# Patient Record
Sex: Female | Born: 1960 | Hispanic: No | Marital: Married | Smoking: Never smoker
Health system: Southern US, Community
[De-identification: ages and names within clinical notes are randomized; demographics above are authoritative.]

## PROBLEM LIST (undated history)

## (undated) DIAGNOSIS — E785 Hyperlipidemia, unspecified: Secondary | ICD-10-CM

## (undated) DIAGNOSIS — H9193 Unspecified hearing loss, bilateral: Secondary | ICD-10-CM

## (undated) HISTORY — DX: Hyperlipidemia, unspecified: E78.5

## (undated) HISTORY — DX: Unspecified hearing loss, bilateral: H91.93

---
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
## 1991-06-10 HISTORY — PX: BREAST BIOPSY: SHX20

## 2002-03-09 ENCOUNTER — Other Ambulatory Visit: Admission: RE | Admit: 2002-03-09 | Discharge: 2002-03-09 | Payer: Self-pay | Admitting: Obstetrics and Gynecology

## 2004-05-24 ENCOUNTER — Other Ambulatory Visit: Admission: RE | Admit: 2004-05-24 | Discharge: 2004-05-24 | Payer: Self-pay | Admitting: Obstetrics and Gynecology

## 2011-06-10 HISTORY — PX: COLONOSCOPY: SHX174

## 2012-02-05 ENCOUNTER — Ambulatory Visit (INDEPENDENT_AMBULATORY_CARE_PROVIDER_SITE_OTHER): Payer: 59 | Admitting: Family Medicine

## 2012-02-05 ENCOUNTER — Encounter: Payer: Self-pay | Admitting: Physician Assistant

## 2012-02-05 ENCOUNTER — Ambulatory Visit: Payer: 59

## 2012-02-05 VITALS — BP 110/62 | HR 79 | Temp 98.0°F | Resp 16 | Ht 65.0 in | Wt 148.2 lb

## 2012-02-05 DIAGNOSIS — M25552 Pain in left hip: Secondary | ICD-10-CM

## 2012-02-05 DIAGNOSIS — E782 Mixed hyperlipidemia: Secondary | ICD-10-CM | POA: Insufficient documentation

## 2012-02-05 DIAGNOSIS — Z1239 Encounter for other screening for malignant neoplasm of breast: Secondary | ICD-10-CM

## 2012-02-05 DIAGNOSIS — Z1211 Encounter for screening for malignant neoplasm of colon: Secondary | ICD-10-CM

## 2012-02-05 DIAGNOSIS — H9193 Unspecified hearing loss, bilateral: Secondary | ICD-10-CM | POA: Insufficient documentation

## 2012-02-05 DIAGNOSIS — M461 Sacroiliitis, not elsewhere classified: Secondary | ICD-10-CM

## 2012-02-05 DIAGNOSIS — M25559 Pain in unspecified hip: Secondary | ICD-10-CM

## 2012-02-05 DIAGNOSIS — R55 Syncope and collapse: Secondary | ICD-10-CM

## 2012-02-05 DIAGNOSIS — Z0000 Encounter for general adult medical examination without abnormal findings: Secondary | ICD-10-CM

## 2012-02-05 DIAGNOSIS — Z124 Encounter for screening for malignant neoplasm of cervix: Secondary | ICD-10-CM

## 2012-02-05 LAB — CBC WITH DIFFERENTIAL/PLATELET
Basophils Absolute: 0 10*3/uL (ref 0.0–0.1)
Basophils Relative: 1 % (ref 0–1)
Eosinophils Absolute: 0.1 10*3/uL (ref 0.0–0.7)
MCH: 29.8 pg (ref 26.0–34.0)
MCHC: 33.8 g/dL (ref 30.0–36.0)
Monocytes Relative: 8 % (ref 3–12)
Neutro Abs: 3 10*3/uL (ref 1.7–7.7)
Neutrophils Relative %: 55 % (ref 43–77)
Platelets: 353 10*3/uL (ref 150–400)
RDW: 13.8 % (ref 11.5–15.5)

## 2012-02-05 LAB — COMPREHENSIVE METABOLIC PANEL
AST: 17 U/L (ref 0–37)
Alkaline Phosphatase: 58 U/L (ref 39–117)
Glucose, Bld: 82 mg/dL (ref 70–99)
Potassium: 4.2 mEq/L (ref 3.5–5.3)
Sodium: 137 mEq/L (ref 135–145)
Total Bilirubin: 0.5 mg/dL (ref 0.3–1.2)
Total Protein: 7.1 g/dL (ref 6.0–8.3)

## 2012-02-05 LAB — POCT UA - MICROSCOPIC ONLY
Bacteria, U Microscopic: NEGATIVE
Mucus, UA: NEGATIVE

## 2012-02-05 LAB — LIPID PANEL
LDL Cholesterol: 162 mg/dL — ABNORMAL HIGH (ref 0–99)
Triglycerides: 96 mg/dL (ref <150)
VLDL: 19 mg/dL (ref 0–40)

## 2012-02-05 LAB — TSH: TSH: 3.223 u[IU]/mL (ref 0.350–4.500)

## 2012-02-05 LAB — POCT URINALYSIS DIPSTICK
Glucose, UA: NEGATIVE
Ketones, UA: NEGATIVE
Spec Grav, UA: 1.015

## 2012-02-05 NOTE — Progress Notes (Signed)
Subjective:    Patient ID: Patricia Campos, female    DOB: 1960-08-17, 51 y.o.   MRN: [id number]  HPI This 51 y.o. female presents for CPE.  Review of Systems  Constitutional: Negative.   HENT: Negative.   Eyes: Negative.   Respiratory: Negative.   Cardiovascular: Negative.   Gastrointestinal: Negative.   Genitourinary: Negative.   Musculoskeletal: Positive for arthralgias (left hip pain at night.  Resolves when she straightens her leg (likes to sleep on her side with hips and knees flexed) and resolves with ibuprofen.  No pain with exercise.  No weakness, radicular pain or paresthesias.). Negative for myalgias, back pain, joint swelling and gait problem.  Skin: Negative.   Neurological: Negative.   Hematological: Negative.   Psychiatric/Behavioral: Negative.       Past Medical History  Diagnosis Date  . Hyperlipidemia   . Hearing loss of both ears     tinnitus    Past Surgical History  Procedure Date  . Eye surgery 1973    lazy eye    Prior to Admission medications   Medication Sig Start Date End Date Taking? Authorizing Provider  Multiple Vitamins-Minerals tablet Take 1 tablet by mouth daily.   Yes Historical Provider, MD    No Known Allergies  History   Social History  . Marital Status: Unknown    Spouse Name: Dan    Number of Children: 2  . Years of Education: 22   Occupational History  . 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 Smoking status: Never Smoker   . Smokeless tobacco: Never Used  . Alcohol Use: 0.6 - 1.2 oz/week    1-2 Glasses of wine per week     WINE 1-2 GLASSES/WEEK  . Drug Use: Not on file  . Sexually Active: Yes -- Female partner(s)    Birth Control/ Protection: Condom, Post-menopausal   Other Topics Concern  . Not on file   Social History Narrative  . No narrative on file    History reviewed. No pertinent family history.     Objective:   Physical Exam  Vitals reviewed. Constitutional: She  is oriented to person, place, and time. Vital signs are normal. She appears well-developed and well-nourished. No distress.  HENT:  Head: Normocephalic and atraumatic.  Right Ear: Hearing, tympanic membrane, external ear and ear canal normal. No foreign bodies.  Left Ear: Hearing, tympanic membrane, external ear and ear canal normal. No foreign bodies.  Nose: Nose normal.  Mouth/Throat: Uvula is midline, oropharynx is clear and moist and mucous membranes are normal. No oral lesions. Normal dentition. No dental abscesses or uvula swelling. No oropharyngeal exudate.  Eyes: Conjunctivae and EOM are normal. Pupils are equal, round, and reactive to light. Right eye exhibits no discharge. Left eye exhibits no discharge. No scleral icterus.  Fundoscopic exam:      The right eye shows no arteriolar narrowing, no AV nicking, no exudate, no hemorrhage and no papilledema. The right eye shows red reflex.The right eye shows no venous pulsations.      The left eye shows no arteriolar narrowing, no AV nicking, no exudate, no hemorrhage and no papilledema. The left eye shows red reflex.The left eye shows no venous pulsations. Neck: Trachea normal, normal range of motion and full passive range of motion without pain. Neck supple. No spinous process tenderness and no muscular tenderness present. No mass and no thyromegaly present.  Cardiovascular: Normal rate, regular rhythm, normal heart sounds, intact  distal pulses and normal pulses.   Pulmonary/Chest: Effort normal and breath sounds normal. She exhibits no tenderness and no retraction. Right breast exhibits no inverted nipple, no mass, no nipple discharge, no skin change and no tenderness. Left breast exhibits no inverted nipple, no mass, no nipple discharge, no skin change and no tenderness. Breasts are symmetrical.  Abdominal: Soft. Normal appearance and bowel sounds are normal. She exhibits no distension and no mass. There is no hepatosplenomegaly. There is no  tenderness. There is no rigidity, no rebound, no guarding, no CVA tenderness, no tenderness at McBurney's point and negative Murphy's sign. No hernia. Hernia confirmed negative in the right inguinal area and confirmed negative in the left inguinal area.  Genitourinary: Rectum normal, vagina normal and uterus normal. Rectal exam shows no external hemorrhoid and no fissure. No breast swelling, tenderness, discharge or bleeding. Pelvic exam was performed with patient supine. No labial fusion. There is no rash, tenderness, lesion or injury on the right labia. There is no rash, tenderness, lesion or injury on the left labia. Cervix exhibits no motion tenderness, no discharge and no friability. Right adnexum displays no mass, no tenderness and no fullness. Left adnexum displays no mass, no tenderness and no fullness. No erythema, tenderness or bleeding around the vagina. No foreign body around the vagina. No signs of injury around the vagina. No vaginal discharge found.  Musculoskeletal: She exhibits no edema and no tenderness.       Cervical back: Normal.       Thoracic back: Normal.       Lumbar back: Normal.  Lymphadenopathy:       Head (right side): No tonsillar, no preauricular, no posterior auricular and no occipital adenopathy present.       Head (left side): No tonsillar, no preauricular, no posterior auricular and no occipital adenopathy present.    She has no cervical adenopathy.    She has no axillary adenopathy.       Right: No inguinal and no supraclavicular adenopathy present.       Left: No inguinal and no supraclavicular adenopathy present.  Neurological: She is alert and oriented to person, place, and time. She has normal strength and normal reflexes. No cranial nerve deficit. She exhibits normal muscle tone. Coordination and gait normal.  Skin: Skin is warm, dry and intact. No rash noted. She is not diaphoretic. No cyanosis or erythema. Nails show no clubbing.  Psychiatric: She has a  normal mood and affect. Her speech is normal and behavior is normal. Judgment and thought content normal.   LEFT hip: [facility] reading (PRIMARY) by  Dr. Audria Nine.  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  Questionable small changes in the acetabulum consistent with sclerosis.  Results for orders placed in visit on 02/05/12  POCT UA - MICROSCOPIC ONLY      Component Value Range   WBC, Ur, HPF, POC 0-1     RBC, urine, microscopic 0-5     Bacteria, U Microscopic neg     Mucus, UA neg     Epithelial cells, urine per micros 0-8     Crystals, Ur, HPF, POC neg     Casts, Ur, LPF, POC neg     Yeast, UA neg    POCT URINALYSIS DIPSTICK      Component Value Range   Color, UA yellow     Clarity, UA clear     Glucose, UA neg     Bilirubin, UA neg     Ketones, UA  neg     Spec Grav, UA 1.015     Blood, UA moderate     pH, UA 7.0     Protein, UA neg     Urobilinogen, UA 0.2     Nitrite, UA neg     Leukocytes, UA Negative         Assessment & Plan:   1. Routine general medical examination at a health care facility  CBC with Differential, Comprehensive metabolic panel, TSH, POCT UA - Microscopic Only, POCT urinalysis dipstick  2. Mixed hyperlipidemia  Comprehensive metabolic panel, Lipid panel  3. Screening for colon cancer  Ambulatory referral to Gastroenterology  4. Screening for cervical cancer  Pap IG and HPV (high risk) DNA detection  5. Screening for breast cancer  MM Digital Screening  6. Hip pain, left  DG Hip Complete Left; sacroiliitis-ice, NSAIDS, low impact exercise.  If symptoms worsen, refer to Ortho.   I reviewed hip xray and agree with reading as entered above.

## 2012-02-05 NOTE — Patient Instructions (Addendum)
If you have not heard anything regarding the referrals in 1-2 weeks, please contact our office. I will contact you with your lab results as soon as they are available.  If you have not heard from me in 2 weeks, please contact me.    Keeping You Healthy  Get These Tests  Blood Pressure- Have your blood pressure checked by your healthcare provider at least once a year.  Normal blood pressure is 120/80.  Weight- Have your body mass index (BMI) calculated to screen for obesity.  BMI is a measure of body fat based on height and weight.  You can calculate your own BMI at https://www.west-esparza.com/  Cholesterol- Have your cholesterol checked every year.  Diabetes- Have your blood sugar checked every year if you have high blood pressure, high cholesterol, a family history of diabetes or if you are overweight.  Pap Smear- Have a pap smear every 1 to 3 years if you have been sexually active.  If you are older than 65 and recent pap smears have been normal you may not need additional pap smears.  In addition, if you have had a hysterectomy  For benign disease additional pap smears are not necessary.  Mammogram-Yearly mammograms are essential for early detection of breast cancer  Screening for Colon Cancer- Colonoscopy starting at age 14. Screening may begin sooner depending on your family history and other health conditions.  Follow up colonoscopy as directed by your Gastroenterologist.  Screening for Osteoporosis- Screening begins at age 58 with bone density scanning, sooner if you are at higher risk for developing Osteoporosis.  Get these medicines  Calcium with Vitamin D- Your body requires 1200-1500 mg of Calcium a day and [phone] IU of Vitamin D a day.  You can only absorb 500 mg of Calcium at a time therefore Calcium must be taken in 2 or 3 separate doses throughout the day.  Hormones- Hormone therapy has been associated with increased risk for certain cancers and heart disease.  Talk to your  healthcare provider about if you need relief from menopausal symptoms.  Aspirin- Ask your healthcare provider about taking Aspirin to prevent Heart Disease and Stroke.  Get these Immuniztions  Flu shot- Every fall  Pneumonia shot- Once after the age of 66; if you are younger ask your healthcare provider if you need a pneumonia shot.  Tetanus- Every ten years.  Zostavax- Once after the age of 65 to prevent shingles.  Take these steps  Don't smoke- Your healthcare provider can help you quit. For tips on how to quit, ask your healthcare provider or go to www.smokefree.gov or call 1-800 QUIT-NOW.  Be physically active- Exercise 5 days a week for a minimum of 30 minutes.  If you are not already physically active, start slow and gradually work up to 30 minutes of moderate physical activity.  Try walking, dancing, bike riding, swimming, etc.  Eat a healthy diet- Eat a variety of healthy foods such as fruits, vegetables, whole grains, low fat milk, low fat cheeses, yogurt, lean meats, chicken, fish, eggs, dried beans, tofu, etc.  For more information go to www.thenutritionsource.org  Dental visit- Brush and floss teeth twice daily; visit your dentist twice a year.  Eye exam- Visit your Optometrist or Ophthalmologist yearly.  Drink alcohol in moderation- Limit alcohol intake to one drink or less a day.  Never drink and drive.  Depression- Your emotional health is as important as your physical health.  If you're feeling down or losing interest in things you  normally enjoy, please talk to your healthcare provider.  Seat Belts- can save your life; always wear one  Smoke/Carbon Monoxide detectors- These detectors need to be installed on the appropriate level of your home.  Replace batteries at least once a year.  Violence- If anyone is threatening or hurting you, please tell your healthcare provider.  Living Will/ Health care power of attorney- Discuss with your healthcare provider and  family.  Sacroiliac Joint Dysfunction The sacroiliac joint connects the lower part of the spine (the sacrum) with the bones of the pelvis. CAUSES  Sometimes, there is no obvious reason for sacroiliac joint dysfunction. Other times, it may occur   During pregnancy.   After injury, such as:   Car accidents.   Sport-related injuries.   Work-related injuries.   Due to one leg being shorter than the other.   Due to other conditions that affect the joints, such as:   Rheumatoid arthritis.   Gout.   Psoriasis.   Joint infection (septic arthritis).  SYMPTOMS  Symptoms may include:  Pain in the:   Lower back.   Buttocks.   Groin.   Thighs and legs.   Difficult sitting, standing, walking, lying, bending or lifting.  DIAGNOSIS  A number of tests may be used to help diagnose the cause of sacroiliac joint dysfunction, including:  Imaging tests to look for other causes of pain, including:   MRI.   CT scan.   Bone scan.   Diagnostic injection: During a special x-ray (called fluoroscopy), a needle is put into the sacroiliac joint. A numbing medicine is injected into the joint. If the pain is improved or stopped, the diagnosis of sacroiliac joint dysfunction is more likely.  TREATMENT  There are a number of types of treatment used for sacroiliac joint dysfunction, including:  Only take over-the-counter or prescription medicines for pain, discomfort, or fever as directed by your caregiver.   Medications to relax muscles.   Rest. Decreasing activity can help cut down on painful muscle spasms and allow the back to heal.   Application of heat or ice to the lower back may improve muscle spasms and soothe pain.   Brace. A special back brace, called a sacroiliac belt, can help support the joint while your back is healing.   Physical therapy can help teach comfortable positions and exercises to strengthen muscles that support the sacroiliac joint.   Cortisone injections.  Injections of steroid medicine into the joint can help decrease swelling and improve pain.   Hyaluronic acid injections. This chemical improves lubrication within the sacroiliac joint, thereby decreasing pain.   Radiofrequency ablation. A special needle is placed into the joint, where it burns away nerves that are carrying pain messages from the joint.   Surgery. Because pain occurs during movement of the joint, screws and plates may be installed in order to limit or prevent joint motion.  HOME CARE INSTRUCTIONS   Take all medications exactly as directed.   Follow instructions regarding both rest and physical activity, to avoid worsening the pain.   Do physical therapy exercises exactly as prescribed.  SEEK IMMEDIATE MEDICAL CARE IF:  You experience increasingly severe pain.   You develop new symptoms, such as numbness or tingling in your legs or feet.   You lose bladder or bowel control.  Document Released: 08/22/2008 Document Revised: 05/15/2011 Document Reviewed: 08/22/2008 [facility] Patient Information 2012 [place], [place].

## 2012-02-06 ENCOUNTER — Encounter: Payer: Self-pay | Admitting: Internal Medicine

## 2012-02-11 ENCOUNTER — Encounter: Payer: Self-pay | Admitting: Physician Assistant

## 2012-02-11 LAB — PAP IG AND HPV HIGH-RISK

## 2012-02-24 ENCOUNTER — Ambulatory Visit
Admission: RE | Admit: 2012-02-24 | Discharge: 2012-02-24 | Disposition: A | Discharge status: Home / Self Care | Payer: 59 | Source: Ambulatory Visit | Attending: Physician Assistant | Admitting: Physician Assistant

## 2012-02-24 DIAGNOSIS — Z1239 Encounter for other screening for malignant neoplasm of breast: Secondary | ICD-10-CM

## 2012-03-12 ENCOUNTER — Ambulatory Visit (AMBULATORY_SURGERY_CENTER): Payer: 59 | Admitting: *Deleted

## 2012-03-12 VITALS — Ht 65.0 in | Wt 146.0 lb

## 2012-03-12 DIAGNOSIS — Z1211 Encounter for screening for malignant neoplasm of colon: Secondary | ICD-10-CM

## 2012-03-12 MED ORDER — SUPREP BOWEL PREP KIT 17.5-3.13-1.6 GM/177ML PO SOLN: ORAL | Status: DC

## 2012-03-15 ENCOUNTER — Encounter: Payer: Self-pay | Admitting: Internal Medicine

## 2012-03-17 ENCOUNTER — Encounter: Payer: Self-pay | Admitting: Internal Medicine

## 2012-03-25 ENCOUNTER — Encounter: Payer: Self-pay | Admitting: Internal Medicine

## 2012-03-25 ENCOUNTER — Ambulatory Visit (AMBULATORY_SURGERY_CENTER): Payer: 59 | Admitting: Internal Medicine

## 2012-03-25 VITALS — BP 110/70 | HR 59 | Temp 98.7°F | Resp 16 | Ht 65.0 in | Wt 146.0 lb

## 2012-03-25 DIAGNOSIS — Z1211 Encounter for screening for malignant neoplasm of colon: Secondary | ICD-10-CM

## 2012-03-25 MED ORDER — SODIUM CHLORIDE 0.9 % IV SOLN: 500.0000 mL | INTRAVENOUS | Status: DC

## 2012-03-25 NOTE — Op Note (Signed)
[facility] [address] [place], [address]   COLONOSCOPY PROCEDURE REPORT  PATIENT: [name], Patricia Campos  MR#: [id number] BIRTHDATE: 1960/08/23 , 51  yrs. old GENDER: Female ENDOSCOPIST: Iva Boop, MD, [facility] REFERRED XB:JYNWGN [place], [place] PROCEDURE DATE:  03/25/2012 PROCEDURE:   Colonoscopy, screening ASA CLASS:   Class I INDICATIONS:average risk screening. MEDICATIONS: propofol (Diprivan) 150mg  IV  DESCRIPTION OF PROCEDURE:   After the risks benefits and alternatives of the procedure were thoroughly explained, informed consent was obtained.  A digital rectal exam revealed no abnormalities of the rectum.   The LB [id number]-[id number]  endoscope was introduced through the anus and advanced to the cecum, which was identified by both the appendix and ileocecal valve. No adverse events experienced.   The quality of the prep was Suprep excellent The instrument was then slowly withdrawn as the colon was fully examined.      COLON FINDINGS: A normal appearing cecum, ileocecal valve, and appendiceal orifice were identified.  The ascending, hepatic flexure, transverse, splenic flexure, descending, sigmoid colon and rectum appeared unremarkable.  No polyps or cancers were seen. Retroflexed views revealed no abnormalities. The time to cecum=2 minutes 14 seconds.  Withdrawal time=6 minutes 02 seconds.  The scope was withdrawn and the procedure completed. COMPLICATIONS: There were no complications.  ENDOSCOPIC IMPRESSION: Normal colonoscopy - excellent prep  RECOMMENDATIONS: repeat Colonscopy in 10 years.   eSigned:  Iva Boop, MD, [facility] 03/25/2012 11:19 AM   cc: Theora Gianotti, PA and The Patient

## 2012-03-25 NOTE — Patient Instructions (Addendum)
The colonoscopy was normal - no polyps!  Thank you for choosing me and [facility].  Iva Boop, MD, FACG YOU HAD AN ENDOSCOPIC PROCEDURE TODAY AT THE [facility]: Refer to the procedure report that was given to you for any specific questions about what was found during the examination.  If the procedure report does not answer your questions, please call your gastroenterologist to clarify.  If you requested that your care partner not be given the details of your procedure findings, then the procedure report has been included in a sealed envelope for you to review at your convenience later.  YOU SHOULD EXPECT: Some feelings of bloating in the abdomen. Passage of more gas than usual.  Walking can help get rid of the air that was put into your GI tract during the procedure and reduce the bloating. If you had a lower endoscopy (such as a colonoscopy or flexible sigmoidoscopy) you may notice spotting of blood in your stool or on the toilet paper. If you underwent a bowel prep for your procedure, then you may not have a normal bowel movement for a few days.  DIET: Your first meal following the procedure should be a light meal and then it is ok to progress to your normal diet.  A half-sandwich or bowl of soup is an example of a good first meal.  Heavy or fried foods are harder to digest and may make you feel nauseous or bloated.  Likewise meals heavy in dairy and vegetables can cause extra gas to form and this can also increase the bloating.  Drink plenty of fluids but you should avoid alcoholic beverages for 24 hours.  ACTIVITY: Your care partner should take you home directly after the procedure.  You should plan to take it easy, moving slowly for the rest of the day.  You can resume normal activity the day after the procedure however you should NOT DRIVE or use heavy machinery for 24 hours (because of the sedation medicines used during the test).    SYMPTOMS TO REPORT  IMMEDIATELY: A gastroenterologist can be reached at any hour.  During normal business hours, 8:30 AM to 5:00 PM Monday through Friday, call [phone].  After hours and on weekends, please call the GI answering service at [phone] who will take a message and have the physician on call contact you.   Following lower endoscopy (colonoscopy or flexible sigmoidoscopy):  Excessive amounts of blood in the stool  Significant tenderness or worsening of abdominal pains  Swelling of the abdomen that is new, acute  Fever of 100F or higher     FOLLOW UP: If any biopsies were taken you will be contacted by phone or by letter within the next 1-3 weeks.  Call your gastroenterologist if you have not heard about the biopsies in 3 weeks.  Our staff will call the home number listed on your records the next business day following your procedure to check on you and address any questions or concerns that you may have at that time regarding the information given to you following your procedure. This is a courtesy call and so if there is no answer at the home number and we have not heard from you through the emergency physician on call, we will assume that you have returned to your regular daily activities without incident.  SIGNATURES/CONFIDENTIALITY: You and/or your care partner have signed paperwork which will be entered into your electronic medical record.  These signatures attest to the  fact that that the information above on your After Visit Summary has been reviewed and is understood.  Full responsibility of the confidentiality of this discharge information lies with you and/or your care-partner.   Normal Colonoscopy  Repeat in 10 years-2023

## 2012-03-25 NOTE — Progress Notes (Signed)
Patient did not experience any of the following events: a burn prior to discharge; a fall within the facility; wrong site/side/patient/procedure/implant event; or a hospital transfer or hospital admission upon discharge from the facility. (G8907) Patient did not have preoperative order for IV antibiotic SSI prophylaxis. (G8918)  

## 2012-03-26 ENCOUNTER — Telehealth: Payer: Self-pay | Admitting: *Deleted

## 2012-03-26 NOTE — Telephone Encounter (Signed)
  Follow up Call-  Call back number 03/25/2012  Post procedure Call Back phone  # [phone] cell  Permission to leave phone message Yes     Patient questions:  Do you have a fever, pain , or abdominal swelling? no Pain Score  0 *  Have you tolerated food without any problems? yes  Have you been able to return to your normal activities? yes  Do you have any questions about your discharge instructions: Diet   no Medications  no Follow up visit  no  Do you have questions or concerns about your Care? no  Actions: * If pain score is 4 or above: No action needed, pain <4.

## 2012-10-28 ENCOUNTER — Encounter: Payer: 59 | Admitting: Internal Medicine

## 2013-05-21 ENCOUNTER — Ambulatory Visit (INDEPENDENT_AMBULATORY_CARE_PROVIDER_SITE_OTHER): Payer: 59 | Admitting: Emergency Medicine

## 2013-05-21 VITALS — BP 122/78 | HR 88 | Temp 98.0°F | Resp 17 | Ht 66.0 in | Wt 151.0 lb

## 2013-05-21 DIAGNOSIS — Z0000 Encounter for general adult medical examination without abnormal findings: Secondary | ICD-10-CM

## 2013-05-21 DIAGNOSIS — Z23 Encounter for immunization: Secondary | ICD-10-CM

## 2013-05-21 DIAGNOSIS — Z1239 Encounter for other screening for malignant neoplasm of breast: Secondary | ICD-10-CM

## 2013-05-21 LAB — POCT CBC
HCT, POC: 47.3 % (ref 37.7–47.9)
Lymph, poc: 1.8 (ref 0.6–3.4)
MPV: 8.7 fL (ref 0–99.8)
POC LYMPH PERCENT: 33.4 %L (ref 10–50)
Platelet Count, POC: 340 10*3/uL (ref 142–424)

## 2013-05-21 LAB — POCT URINALYSIS DIPSTICK
Bilirubin, UA: NEGATIVE
Glucose, UA: NEGATIVE
Spec Grav, UA: 1.01
Urobilinogen, UA: 0.2

## 2013-05-21 LAB — COMPREHENSIVE METABOLIC PANEL
AST: 16 U/L (ref 0–37)
Albumin: 4.7 g/dL (ref 3.5–5.2)
Alkaline Phosphatase: 59 U/L (ref 39–117)
BUN: 16 mg/dL (ref 6–23)
Calcium: 9.9 mg/dL (ref 8.4–10.5)
Chloride: 101 mEq/L (ref 96–112)
Glucose, Bld: 78 mg/dL (ref 70–99)
Potassium: 4.4 mEq/L (ref 3.5–5.3)
Sodium: 137 mEq/L (ref 135–145)
Total Protein: 7.7 g/dL (ref 6.0–8.3)

## 2013-05-21 LAB — POCT UA - MICROSCOPIC ONLY: Mucus, UA: NEGATIVE

## 2013-05-21 LAB — TSH: TSH: 2.096 u[IU]/mL (ref 0.350–4.500)

## 2013-05-21 LAB — LIPID PANEL
HDL: 55 mg/dL (ref >39)
LDL Cholesterol: 149 mg/dL — ABNORMAL HIGH (ref 0–99)
Triglycerides: 167 mg/dL — ABNORMAL HIGH (ref <150)

## 2013-05-21 NOTE — Progress Notes (Signed)
[facility] [address] [place] [address] [phone]  Date:  05/21/2013   Name:  Patricia Campos   DOB:  [birth year]   MRN:  [id number]  PCP:  No primary provider on file.    Chief Complaint: Employment Physical and Immunizations   History of Present Illness:  Patricia Campos is a 52 y.o. very pleasant female patient who presents with the following:  Needs flu shot and wellness examination.  No medications. No chronic medical problems.  No acute health concerns.  Up to date on PAP and colonoscopy and needs  MAMMO  Patient Active Problem List   Diagnosis Date Noted  . Mixed hyperlipidemia 02/05/2012  . Sacroiliitis 02/05/2012  . Hearing loss of both ears     Past Medical History  Diagnosis Date  . Hyperlipidemia   . Hearing loss of both ears     tinnitus    Past Surgical History  Procedure Laterality Date  . Eye surgery  1973    lazy eye  . Breast biopsy  1993    right benign    History  Substance Use Topics  . Smoking status: Never Smoker   . Smokeless tobacco: Never Used  . Alcohol Use: 0.6 oz/week    1 Glasses of wine per week     Comment: WINE 1 or less GLASSES/WEEK    Family History  Problem Relation Age of Onset  . Hyperlipidemia Mother   . Irritable bowel syndrome Mother   . Hyperlipidemia Brother   . Colon cancer Neg Hx   . Esophageal cancer Neg Hx   . Rectal cancer Neg Hx   . Stomach cancer Neg Hx     No Known Allergies  Medication list has been reviewed and updated.  Current Outpatient Prescriptions on File Prior to Visit  Medication Sig Dispense Refill  . ibuprofen (ADVIL,MOTRIN) 200 MG tablet Take 400 mg by mouth every 6 (six) hours as needed.      . Multiple Vitamins-Minerals tablet Take 1 tablet by mouth daily.      . OMEGA-3 KRILL OIL PO Take by mouth daily.       No current facility-administered medications on file prior to visit.    Review of Systems:  As per HPI, otherwise negative.    Physical  Examination: Filed Vitals:   05/21/13 0855  BP: 122/78  Pulse: 88  Temp: 98 F (36.7 C)  Resp: 17   Filed Vitals:   05/21/13 0855  Height: 5\' 6"  (1.676 m)  Weight: 151 lb (68.493 kg)   Body mass index is 24.38 kg/(m^2). Ideal Body Weight: Weight in (lb) to have BMI = 25: 154.6  GEN: WDWN, NAD, Non-toxic, A & O x 3 HEENT: Atraumatic, Normocephalic. Neck supple. No masses, No LAD. Ears and Nose: No external deformity. CV: RRR, No M/G/R. No JVD. No thrill. No extra heart sounds. PULM: CTA B, no wheezes, crackles, rhonchi. No retractions. No resp. distress. No accessory muscle use. ABD: S, NT, ND, +BS. No rebound. No HSM. EXTR: No c/c/e NEURO Normal gait.  PSYCH: Normally interactive. Conversant. Not depressed or anxious appearing.  Calm demeanor.    Assessment and Plan: Wellness examination Labs Follow up based on labs Flu  Signed,  Phillips Odor, MD

## 2013-05-21 NOTE — Addendum Note (Signed)
Addended byAlden Benjamin R on: 05/21/2013 10:20 AM   Modules accepted: Orders

## 2013-06-29 ENCOUNTER — Ambulatory Visit
Admission: RE | Admit: 2013-06-29 | Discharge: 2013-06-29 | Disposition: A | Discharge status: Home / Self Care | Payer: 59 | Source: Ambulatory Visit | Attending: Emergency Medicine | Admitting: Emergency Medicine

## 2013-06-29 DIAGNOSIS — Z1239 Encounter for other screening for malignant neoplasm of breast: Secondary | ICD-10-CM

## 2013-09-25 IMAGING — CR DG HIP (WITH OR WITHOUT PELVIS) 2-3V*L*
3 series · 3 of 3 positions shown · non-contrast
Comparison: None.

CLINICAL DATA: Pain.

LEFT HIP - COMPLETE 2+ VIEW

[AP]
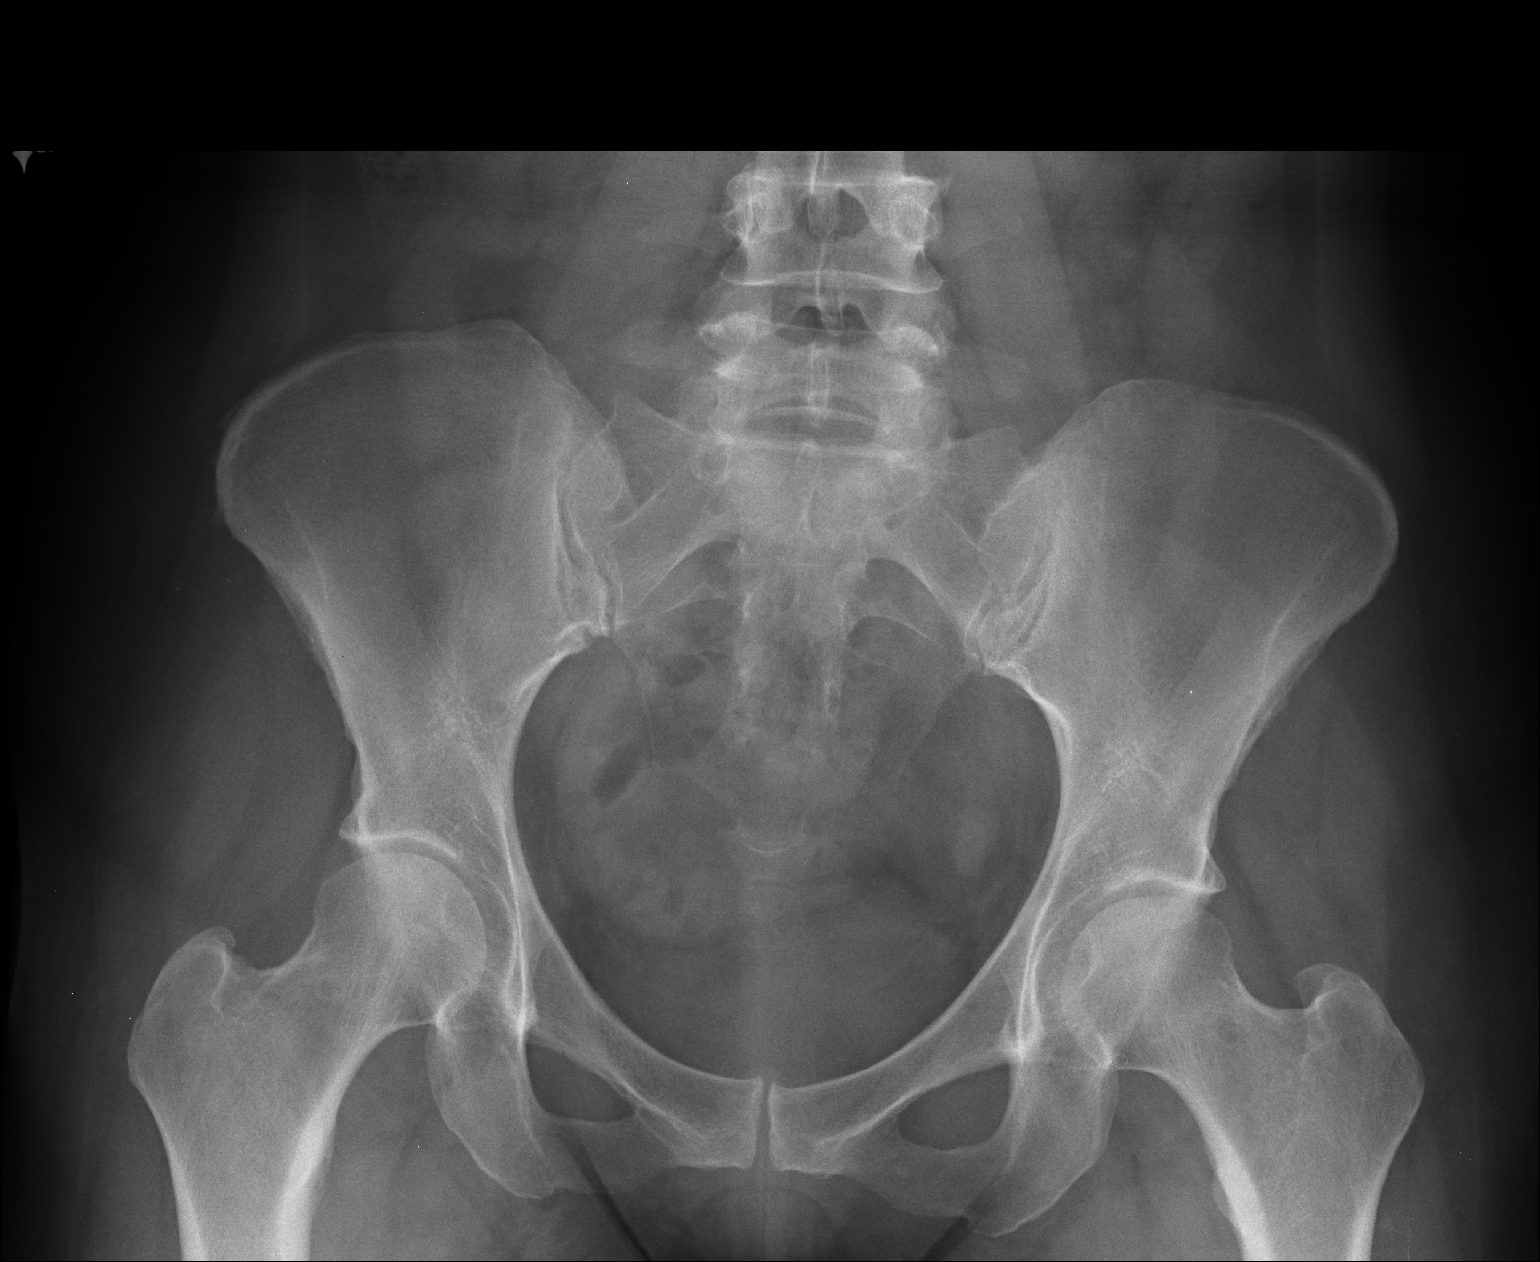

[lateral (1 of 2)]
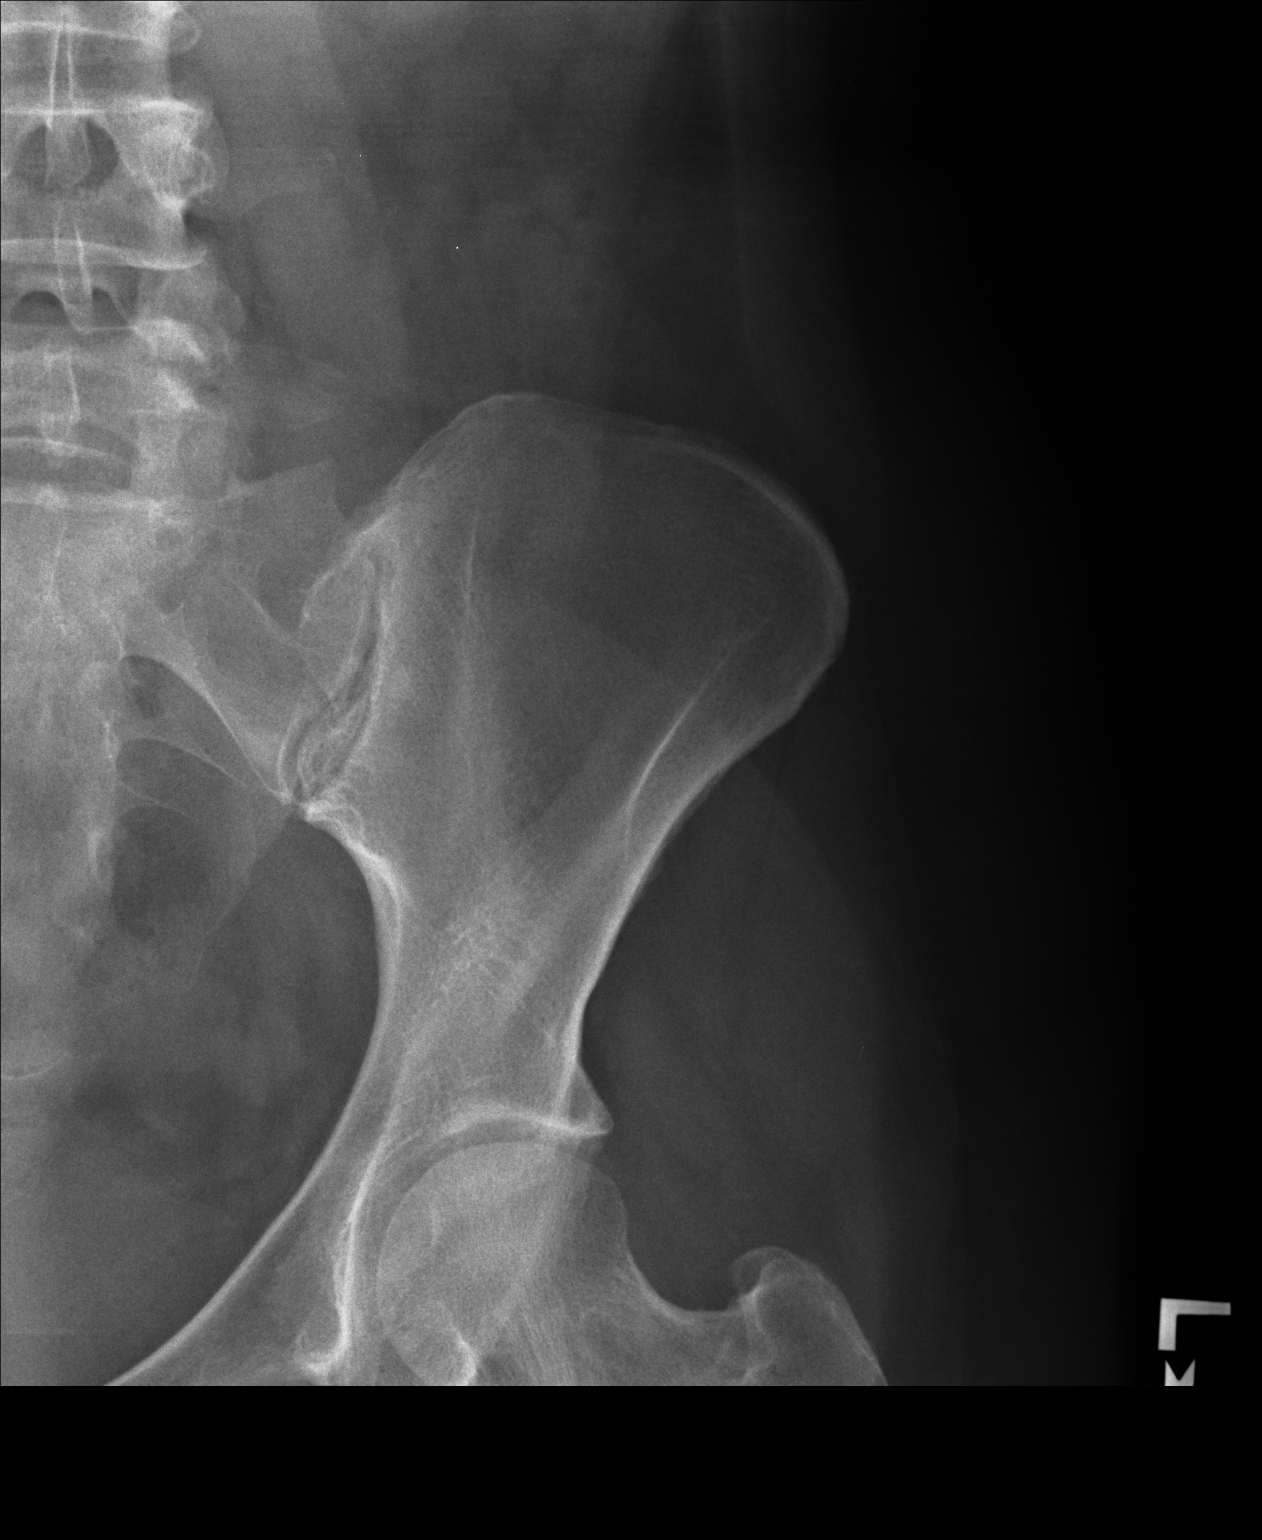

[lateral (2 of 2)]
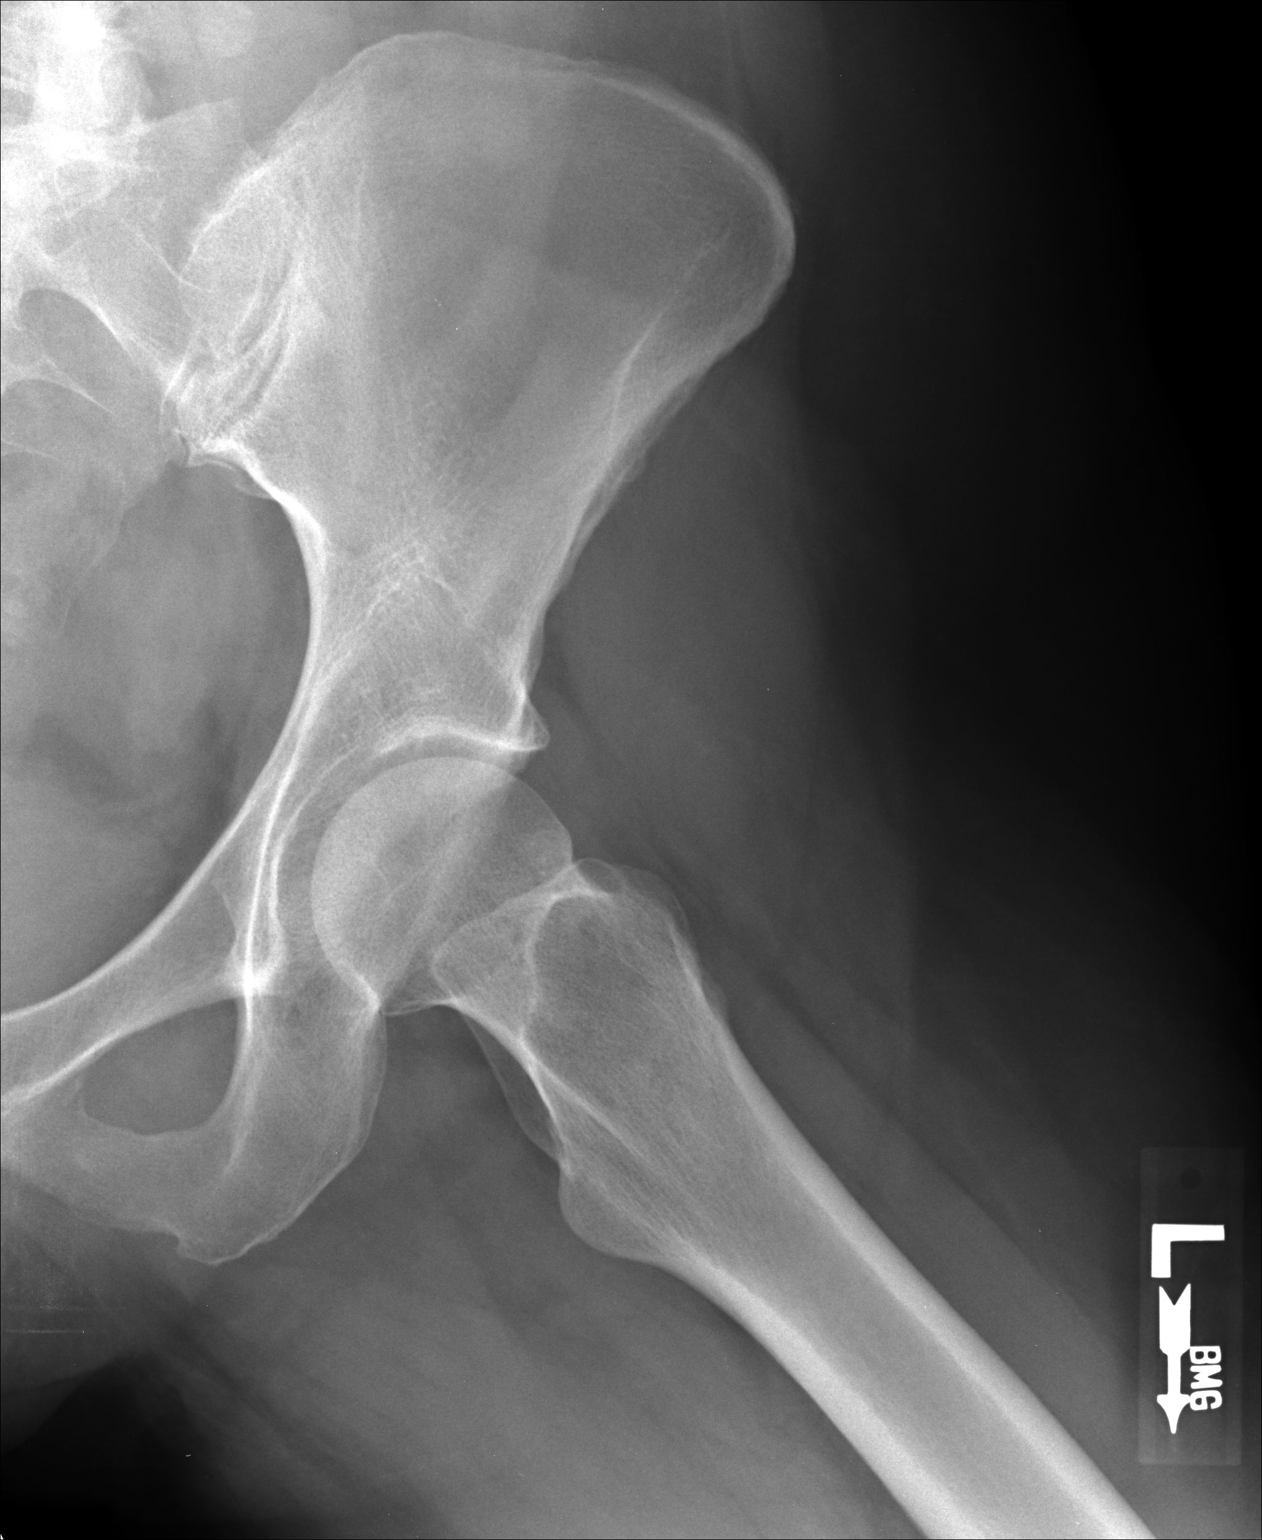

[3 of 3 positions shown; findings below may reference images not displayed]

FINDINGS: Imaged bones, joints and soft tissues appear normal.
IMPRESSION: Negative study.

Clinically significant discrepancy from primary report, if
provided: None

## 2013-10-19 ENCOUNTER — Telehealth: Payer: Self-pay

## 2013-10-19 NOTE — Telephone Encounter (Signed)
Pt dropped off health form from [facility] to be completed from 05/21/13 OV. I have put in Dr Ewell PoeAnderson's box for completion.

## 2013-10-19 NOTE — Telephone Encounter (Signed)
Copied/scanned form and called pt to advise orig is ready for p/up.

## 2014-03-17 ENCOUNTER — Ambulatory Visit (INDEPENDENT_AMBULATORY_CARE_PROVIDER_SITE_OTHER): Payer: 59 | Admitting: Family Medicine

## 2014-03-17 ENCOUNTER — Encounter: Payer: Self-pay | Admitting: Family Medicine

## 2014-03-17 VITALS — BP 112/72 | HR 64 | Temp 98.1°F | Resp 16 | Ht 65.0 in | Wt 147.8 lb

## 2014-03-17 DIAGNOSIS — G4452 New daily persistent headache (NDPH): Secondary | ICD-10-CM

## 2014-03-17 DIAGNOSIS — S161XXA Strain of muscle, fascia and tendon at neck level, initial encounter: Secondary | ICD-10-CM

## 2014-03-17 MED ORDER — MELOXICAM 7.5 MG PO TABS: ORAL_TABLET | ORAL | Status: DC

## 2014-03-17 MED ORDER — CYCLOBENZAPRINE HCL 5 MG PO TABS: 5.0000 mg | ORAL_TABLET | Freq: Three times a day (TID) | ORAL | Status: DC | PRN

## 2014-03-17 NOTE — Patient Instructions (Signed)

## 2014-03-20 NOTE — Progress Notes (Signed)
S:  This 53 y.o. Cauc female is here for evaluation of posterior neck pain, located on L side x 4-6 weeks. Now radiating up L side into jaw and ear. Pt c/o temporal HA. No hx of trauma. No ear pain or pain w/ chewing or biting. Pt denies fever/chills, rhinorrhea, sore throat, sinus pain, eye pain/tearing/ redness, photophobia or vision disturbances. She has no dizziness or numbness. Pt has some relief w/ Ibuprofen and topical remedies.  Patient Active Problem List   Diagnosis Date Noted  . Mixed hyperlipidemia 02/05/2012  . Sacroiliitis 02/05/2012  . Hearing loss of both ears     Prior to Admission medications   Medication Sig Start Date End Date Taking? Authorizing Provider  ibuprofen (ADVIL,MOTRIN) 200 MG tablet Take 400 mg by mouth every 6 (six) hours as needed.   Yes Historical Provider, MD  Multiple Vitamins-Minerals tablet Take 1 tablet by mouth daily.   Yes Historical Provider, MD  OMEGA-3 KRILL OIL PO Take by mouth daily.   Yes Historical Provider, MD   History   Social History  . Marital Status: Unknown    Spouse Name: Dan    Number of Children: 2  . Years of Education: 22   Occupational History  . 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 Smoking status: Never Smoker   . Smokeless tobacco: Never Used  . Alcohol Use: 0.6 oz/week    1 Glasses of wine per week     Comment: WINE 1 or less GLASSES/WEEK  . Drug Use: No  . Sexual Activity: Yes    Partners: Male    Birth Control/ Protection: Condom, Post-menopausal   Other Topics Concern  . Not on file   Social History Narrative  . No narrative on file    ROS; As per HPI.  O: Filed Vitals:   03/17/14 1122  BP: 112/72  Pulse: 64  Temp: 98.1 F (36.7 C)  Resp: 16    GEN: In NAD: WN,WD. HENT: [place]/AT; EOMI w/ clear conj/sclerae. Ext/ears/EACs/TMs normal. Nasal mucosa normal. Oroph/tongue/dentition and post ph normal. NECK: No LAN/ TMG. Decreased ROM w/ spasms on posterior L. No bony  tenderness or deformity. COR: RRR. LUNGS: Normal resp rate and effort. SKIN: W&D. NEURO: A&O x 3; CNs intact. Nonfocal.  A/P: Cervical muscle strain, initial encounter- Moist heat and massage. Adequate support (pillows that provide cervical support).  New daily persistent headache  Meds ordered this encounter  Medications  . cyclobenzaprine (FLEXERIL) 5 MG tablet    Sig: Take 1 tablet (5 mg total) by mouth 3 (three) times daily as needed for muscle spasms.    Dispense:  30 tablet    Refill:  1  . meloxicam (MOBIC) 7.5 MG tablet    Sig: Take 1 tablet twice a day as needed for pain; take with food or snack.    Dispense:  40 tablet    Refill:  1

## 2014-04-10 ENCOUNTER — Encounter: Payer: Self-pay | Admitting: Family Medicine

## 2015-11-08 ENCOUNTER — Ambulatory Visit (INDEPENDENT_AMBULATORY_CARE_PROVIDER_SITE_OTHER): Payer: BLUE CROSS/BLUE SHIELD | Admitting: Urgent Care

## 2015-11-08 VITALS — BP 102/66 | HR 71 | Temp 97.6°F | Resp 16 | Ht 65.0 in | Wt 151.8 lb

## 2015-11-08 DIAGNOSIS — M25462 Effusion, left knee: Secondary | ICD-10-CM

## 2015-11-08 DIAGNOSIS — M25562 Pain in left knee: Secondary | ICD-10-CM

## 2015-11-08 MED ORDER — NAPROXEN SODIUM 550 MG PO TABS: 550.0000 mg | ORAL_TABLET | Freq: Two times a day (BID) | ORAL | Status: DC

## 2015-11-08 NOTE — Patient Instructions (Addendum)
Knee Pain Knee pain is a very common symptom and can have many causes. Knee pain often goes away when you follow your health care provider's instructions for relieving pain and discomfort at home. However, knee pain can develop into a condition that needs treatment. Some conditions may include:  Arthritis caused by wear and tear (osteoarthritis).  Arthritis caused by swelling and irritation (rheumatoid arthritis or gout).  A cyst or growth in your knee.  An infection in your knee joint.  An injury that will not heal.  Damage, swelling, or irritation of the tissues that support your knee (torn ligaments or tendinitis). If your knee pain continues, additional tests may be ordered to diagnose your condition. Tests may include X-rays or other imaging studies of your knee. You may also need to have fluid removed from your knee. Treatment for ongoing knee pain depends on the cause, but treatment may include:  Medicines to relieve pain or swelling.  Steroid injections in your knee.  Physical therapy.  Surgery. HOME CARE INSTRUCTIONS  Take medicines only as directed by your health care provider.  Rest your knee and keep it raised (elevated) while you are resting.  Do not do things that cause or worsen pain.  Avoid high-impact activities or exercises, such as running, jumping rope, or doing jumping jacks.  Apply ice to the knee area:  Put ice in a plastic bag.  Place a towel between your skin and the bag.  Leave the ice on for 20 minutes, 2-3 times a day.  Ask your health care provider if you should wear an elastic knee support.  Keep a pillow under your knee when you sleep.  Lose weight if you are overweight. Extra weight can put pressure on your knee.  Do not use any tobacco products, including cigarettes, chewing tobacco, or electronic cigarettes. If you need help quitting, ask your health care provider. Smoking may slow the healing of any bone and joint problems that you may  have. SEEK MEDICAL CARE IF:  Your knee pain continues, changes, or gets worse.  You have a fever along with knee pain.  Your knee buckles or locks up.  Your knee becomes more swollen. SEEK IMMEDIATE MEDICAL CARE IF:   Your knee joint feels hot to the touch.  You have chest pain or trouble breathing.   This information is not intended to replace advice given to you by your health care provider. Make sure you discuss any questions you have with your health care provider.   Document Released: 03/23/2007 Document Revised: 06/16/2014 Document Reviewed: 01/09/2014 Elsevier Interactive Patient Education 2016 Elsevier Inc.     IF you received an x-ray today, you will receive an invoice from [facility]. Please contact [facility] at [phone] with questions or concerns regarding your invoice.   IF you received labwork today, you will receive an invoice from [facility]. Please contact [facility] at [phone] with questions or concerns regarding your invoice.   Our billing staff will not be able to assist you with questions regarding bills from these companies.  You will be contacted with the lab results as soon as they are available. The fastest way to get your results is to activate your My Chart account. Instructions are located on the last page of this paperwork. If you have not heard from us regarding the results in 2 weeks, please contact this office.      

## 2015-11-08 NOTE — Progress Notes (Signed)
    MRN: [id number] DOB: 10/05/1960  Subjective:   Patricia Campos is a 55 y.o. female presenting for chief complaint of Knee Injury  Reports 5 day history of left knee pain, swelling. The pain is intermittent and sharp, is very transient, is aggravated by certain movements. She has been using a sleeve with some relief. Has been using ibuprofen 200mg  occasionally without any significant changes. Patient cannot recall any inciting event except for a new stretch that she tried with her son. She hyperflexed her knees and then fully extended her body. She didn't feel any particular pain at that time but has since had worsening knee pain. She swims regularly for exercise. Denies fever, redness, popping, clicking, buckling, knee instability, falls, trauma.  Patricia Campos has a current medication list which includes the following prescription(s): ibuprofen and multiple vitamins-minerals. Also has No Known Allergies.  Patricia Campos  has a past medical history of Hyperlipidemia and Hearing loss of both ears. Also  has past surgical history that includes Eye surgery (1973) and Breast biopsy (1993).  Objective:   Vitals: BP 102/66 mmHg  Pulse 71  Temp(Src) 97.6 F (36.4 C) (Oral)  Resp 16  Ht 5\' 5"  (1.651 m)  Wt 151 lb 12.8 oz (68.856 kg)  BMI 25.26 kg/m2  SpO2 98%  Physical Exam  Constitutional: She is oriented to person, place, and time. She appears well-developed and well-nourished.  Cardiovascular: Normal rate.   Pulmonary/Chest: Effort normal.  Musculoskeletal:       Left knee: She exhibits swelling (trace edema laterally). She exhibits normal range of motion, no effusion, no ecchymosis, no deformity, no laceration, no erythema, normal alignment, normal patellar mobility, no bony tenderness and normal meniscus. No tenderness found.  Neurological: She is alert and oriented to person, place, and time. She has normal reflexes.  Skin: Skin is warm and dry.   Assessment and Plan :   1. Left knee  pain 2. Knee swelling, left - Will start conservative management with Anaprox and PT. Patient is to continue wearing a sleeve, use hot and cold packs after any physical activity. RTC if knee pain does not improve. She declined knee x-ray today but may need this if the problem persists.  Wallis BambergMario Tadan Shill, PA-C [facility] [phone] 11/08/2015 8:59 AM

## 2015-11-16 ENCOUNTER — Encounter: Payer: Self-pay | Admitting: Physical Therapy

## 2015-11-16 ENCOUNTER — Ambulatory Visit: Payer: BLUE CROSS/BLUE SHIELD | Attending: Urgent Care | Admitting: Physical Therapy

## 2015-11-16 DIAGNOSIS — M25562 Pain in left knee: Secondary | ICD-10-CM | POA: Insufficient documentation

## 2015-11-16 NOTE — Therapy (Addendum)
[facility] [address] [place], [place], [address] Phone: [phone]   Fax:  [phone]  Physical Therapy Evaluation/Discharge  Patient Details  Name: Patricia Campos MRN: [id number] Date of Birth: 08-03-1960 Referring Provider: Wallis Bamberg, PA-C  Encounter Date: 11/16/2015      PT End of Session - 11/16/15 0847    Visit Number 1   Number of Visits 2   Date for PT Re-Evaluation 11/19/15   PT Start Time 0807   PT Stop Time 0847   PT Time Calculation (min) 40 min   Activity Tolerance Patient tolerated treatment well   Behavior During Therapy [facility] for tasks assessed/performed      Past Medical History  Diagnosis Date  . Hyperlipidemia   . Hearing loss of both ears     tinnitus    Past Surgical History  Procedure Laterality Date  . Eye surgery  1973    lazy eye  . Breast biopsy  1993    right benign    There were no vitals filed for this visit.       Subjective Assessment - 11/16/15 0809    Subjective Has been swimming for exercises amost daily. Tried boot camp class at pool, tried a stretch where she sits on her feet and lays back- remembers knee hurting soon after that. No sharp pain during stretch   Limitations Other (comment)  swimming, squatting   How long can you sit comfortably? unlimited   How long can you stand comfortably? unlimited   How long can you walk comfortably? unlimited   Patient Stated Goals stand from chair (min pain now), swim, turning quickly   Currently in Pain? No/denies   Aggravating Factors  swimming (frog kick)   Pain Relieving Factors rest            [facility] PT Assessment - 11/16/15 0001    Assessment   Medical Diagnosis L knee pain    Referring Provider Wallis Bamberg, PA-C   Next MD Visit not scheduled   Prior Therapy no   Precautions   Precautions None   Restrictions   Weight Bearing Restrictions No   Balance Screen   Has the patient fallen in the past 6 months No    Home Environment   Living Environment Private residence   Home Layout Two level   Prior Function   Level of Independence Independent   Cognition   Overall Cognitive Status Within Functional Limits for tasks assessed   ROM / Strength   AROM / PROM / Strength Strength   Strength   Strength Assessment Site Knee;Hip   Right/Left Hip Left   Left Hip Flexion 4/5   Left Hip ABduction 4/5   Right/Left Knee Left   Left Knee Flexion 4+/5   Left Knee Extension 5/5   Palpation   Patella mobility increased on L due to edema                   [facility] Adult PT Treatment/Exercise - 11/16/15 0001    Exercises   Exercises Knee/Hip   Knee/Hip Exercises: Stretches   Passive Hamstring Stretch 2 reps;30 seconds   Passive Hamstring Stretch Limitations seated EOB   Gastroc Stretch 2 reps;30 seconds   Knee/Hip Exercises: Standing   Heel Raises 20 reps   Heel Raises Limitations neutral and ER   Other Standing Knee Exercises standing fire hydrant x15 ea   Knee/Hip Exercises: Supine   Single Leg Bridge 10 reps   Knee/Hip  Exercises: Sidelying   Hip ABduction 10 reps   Hip ABduction Limitations knee flexion & ext                PT Education - 11/16/15 0848    Education provided Yes   Education Details anatomy of condition, POC. HEP, use of ice and compression bandage   Person(s) Educated Patient   Methods Explanation;Demonstration;Tactile cues;Verbal cues;Handout   Comprehension Verbalized understanding;Returned demonstration;Verbal cues required;Tactile cues required          PT Short Term Goals - 11/16/15 0959    PT SHORT TERM GOAL #1   Title pt will be independent in HEP to continue progressing strength in LE biomechanical chain.    Time 2   Period Weeks   Status New   PT SHORT TERM GOAL #2   Title pt will reports ability to perform daily activities and exercise program without increase in knee pain.    Time 2   Period Weeks   Status New                   Plan - 11/16/15 [id number]    Clinical Impression Statement Pt presents to PT today with complaints of lateral knee pain that began following end range flexion stretch on ground. reports her knee has been slowly getting better. Pt is going OOT for an extended period of time next week and will be d/c at that point. Pt was provided with HEP that she was able to demonstrate with appropraite form with minimal cuing. Discussed avoiding painful swim strokes as well as using ice multiple times daily to decrease edema. pt has good strength at hips but will benefit from further strengthening to provide support to knee joint with active lifestyle.    Rehab Potential Good   PT Frequency 1x / week   PT Duration 2 weeks   PT Treatment/Interventions ADLs/Self Care Home Management;Cryotherapy;Electrical Stimulation;Ultrasound;Traction;Moist Heat;Iontophoresis 4mg /ml Dexamethasone;Stair training;Functional mobility training;Therapeutic activities;Therapeutic exercise;Balance training;Patient/family education;Neuromuscular re-education;Manual techniques;Compression bandaging;Taping;Dry needling;Passive range of motion   PT Next Visit Plan review exercises and treat pain PRN   PT Home Exercise Plan see patient instructions   Consulted and Agree with Plan of Care Patient      Patient will benefit from skilled therapeutic intervention in order to improve the following deficits and impairments:  Pain, Increased edema  Visit Diagnosis: Pain in left knee - Plan: PT plan of care cert/re-cert     Problem List Patient Active Problem List   Diagnosis Date Noted  . Mixed hyperlipidemia 02/05/2012  . Sacroiliitis (HCC) 02/05/2012  . Hearing loss of both ears     Bane Hagy C. Rosaelena Kemnitz PT, DPT 11/16/2015 10:06 AM   [facility] [address] [place], [place], [address] Phone: [phone]   Fax:  [phone]  Name: Patricia Campos MRN: [id number] Date of Birth:  Jul 24, 1960

## 2016-01-26 ENCOUNTER — Encounter: Payer: Self-pay | Admitting: Family Medicine

## 2016-01-26 ENCOUNTER — Ambulatory Visit (INDEPENDENT_AMBULATORY_CARE_PROVIDER_SITE_OTHER): Payer: BLUE CROSS/BLUE SHIELD | Admitting: Family Medicine

## 2016-01-26 VITALS — BP 110/64 | HR 75 | Temp 98.2°F | Resp 16 | Ht 65.5 in | Wt 151.6 lb

## 2016-01-26 DIAGNOSIS — M2392 Unspecified internal derangement of left knee: Secondary | ICD-10-CM | POA: Diagnosis not present

## 2016-01-26 NOTE — Patient Instructions (Addendum)
  I am referring you to orthopedics.  I suspect you have a small tear in the medial mediscus of the left knee   IF you received an x-ray today, you will receive an invoice from [facility]. Please contact [facility] at [phone] with questions or concerns regarding your invoice.   IF you received labwork today, you will receive an invoice from [facility]. Please contact [facility] at [phone] with questions or concerns regarding your invoice.   Our billing staff will not be able to assist you with questions regarding bills from these companies.  You will be contacted with the lab results as soon as they are available. The fastest way to get your results is to activate your My Chart account. Instructions are located on the last page of this paperwork. If you have not heard from us regarding the results in 2 weeks, please contact this office.

## 2016-01-26 NOTE — Progress Notes (Signed)
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 She initially suffered a sudden pain when she was doing and kneeling exercise with hyperflexion 3 months ago. It seemed to heal but on 2 occasions subsequently she is moved awkwardly and noticed swelling and pain with weightbearing.    patient [place] an upcoming trip to [place] and needs to be able to hike.  Objective: There is a small effusion in the left knee on inspection.vs  BP 110/64   Pulse 75   Temp 98.2 F (36.8 C) (Oral)   Resp 16   Ht 5' 5.5" (1.664 m)   Wt 151 lb 9.6 oz (68.8 kg)   SpO2 98%   BMI 24.84 kg/m  Range of motion is diminished secondary to discomfort with flexion greater than 90. Palpation of the knee is normal Bony architecture of the knee is normal Ligamentous testing is normal  McMurray test (lying prone with like flexed and then extending it with pudding external rotation) results in pain.  Assessment: Likely tear in the medial meniscus of left knee      ICD-9-CM ICD-10-CM   1. Internal derangement of knee, left 717.9 M23.92 Ambulatory referral to Orthopedic Surgery     Signed, Elvina SidleKurt Rande Dario, MD

## 2016-07-01 ENCOUNTER — Ambulatory Visit (INDEPENDENT_AMBULATORY_CARE_PROVIDER_SITE_OTHER): Payer: BLUE CROSS/BLUE SHIELD | Admitting: Family Medicine

## 2016-07-01 VITALS — BP 122/72 | HR 64 | Temp 97.5°F | Resp 17 | Ht 65.0 in | Wt 153.0 lb

## 2016-07-01 DIAGNOSIS — Z0000 Encounter for general adult medical examination without abnormal findings: Secondary | ICD-10-CM | POA: Diagnosis not present

## 2016-07-01 DIAGNOSIS — Z1231 Encounter for screening mammogram for malignant neoplasm of breast: Secondary | ICD-10-CM | POA: Diagnosis not present

## 2016-07-01 DIAGNOSIS — R87610 Atypical squamous cells of undetermined significance on cytologic smear of cervix (ASC-US): Secondary | ICD-10-CM

## 2016-07-01 DIAGNOSIS — Z113 Encounter for screening for infections with a predominantly sexual mode of transmission: Secondary | ICD-10-CM

## 2016-07-01 DIAGNOSIS — Z1329 Encounter for screening for other suspected endocrine disorder: Secondary | ICD-10-CM

## 2016-07-01 DIAGNOSIS — Z1389 Encounter for screening for other disorder: Secondary | ICD-10-CM

## 2016-07-01 DIAGNOSIS — Z1383 Encounter for screening for respiratory disorder NEC: Secondary | ICD-10-CM | POA: Diagnosis not present

## 2016-07-01 DIAGNOSIS — Z130 Encounter for screening for diseases of the blood and blood-forming organs and certain disorders involving the immune mechanism: Secondary | ICD-10-CM

## 2016-07-01 DIAGNOSIS — Z124 Encounter for screening for malignant neoplasm of cervix: Secondary | ICD-10-CM

## 2016-07-01 DIAGNOSIS — Z136 Encounter for screening for cardiovascular disorders: Secondary | ICD-10-CM | POA: Diagnosis not present

## 2016-07-01 LAB — POCT URINALYSIS DIP (MANUAL ENTRY)
BILIRUBIN UA: NEGATIVE
BILIRUBIN UA: NEGATIVE
Glucose, UA: NEGATIVE
LEUKOCYTES UA: NEGATIVE
Nitrite, UA: NEGATIVE
PH UA: 6
Protein Ur, POC: NEGATIVE
SPEC GRAV UA: 1.01
Urobilinogen, UA: 0.2

## 2016-07-01 NOTE — Progress Notes (Signed)
Subjective:  This chart was scribed for Norberto SorensonEva Shaw MD, by Veverly FellsHatice Demirci,scribe, at [facility].  This patient was seen in room 2 and the patient's care was started at 12:10 PM.   Chief Complaint  Patient presents with  . Annual Exam    with pap      Patient ID: Patricia Campos, female    DOB: 06/25/1960, 56 y.o.   MRN: [id number]  HPI HPI Comments: Patricia Campos is a 56 y.o. female who presents to the [facility] for an annual physical exam. Patient states that she does not have any questions or concerns. She began menopause 2-3 years ago.She denies any current discharge or odors.  Patient has no history of stds.  She takes vitamins occasionally and drinks milk often.  Has mild exposure to sun.   Last CPE three years prior according to records.    Lipid: Lipid panel has been mildly elevated with LDL 150-160.  Advised to work on TLC.   Screening: Over-due for mammogram done at breast center three years prior- normal.- Patient would like this to be scheduled.   Colonoscopy done by Dr. Leone PayorGessner in 2013. No polyps- normal colonoscopy Pap: Ascus pap with negative high risk HPV done 02/2012 done here.-- recommended to repeat pap and HPV test in three years.   Immunizations: tdap 2010, flu shot intermittently. She has had her flu shot this year.  Immunization History  Administered Date(s) Administered  . Influenza Split 03/09/2009  . Influenza,inj,Quad PF,36+ Mos 05/21/2013  . Influenza-Unspecified 03/09/2016  . Tdap 07/18/2010    Family history: Patient has no family history of heart disease.   Exercise/diet: patient exercises regularly every week. She tries to keep a low fat diet.   Patient has only had tea this morning.     Past Medical History:  Diagnosis Date  . Hearing loss of both ears    tinnitus  . Hyperlipidemia     No current outpatient prescriptions on file prior to visit.   No current facility-administered medications on  file prior to visit.     No Known Allergies   Patient Active Problem List   Diagnosis Date Noted  . Mixed hyperlipidemia 02/05/2012  . Sacroiliitis (HCC) 02/05/2012  . Hearing loss of both ears     Past Surgical History:  Procedure Laterality Date  . BREAST BIOPSY  1993   right benign  . EYE SURGERY  1973   lazy eye   Family History  Problem Relation Age of Onset  . Hyperlipidemia Mother   . Irritable bowel syndrome Mother   . Hyperlipidemia Brother   . Colon cancer Neg Hx   . Esophageal cancer Neg Hx   . Rectal cancer Neg Hx   . Stomach cancer Neg Hx     Social History   Social History  . Marital status: Married    Spouse name: Jesusita OkaDan  . Number of children: 2  . Years of education: 22   Occupational History  . 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 Smoking status: Never Smoker  . Smokeless tobacco: Never Used  . Alcohol use 0.6 oz/week    1 Glasses of wine per week     Comment: WINE 1 or less GLASSES/WEEK  . Drug use: No  . Sexual activity: Yes    Partners: Male    Birth control/ protection: Condom, Post-menopausal   Other Topics Concern  . Not on  file   Social History Narrative  . No narrative on file    Review of Systems  HENT: Positive for hearing loss.   Neurological: Positive for headaches.  All other systems reviewed and are negative.     Objective:   Physical Exam  Constitutional: She is oriented to person, place, and time. She appears well-developed and well-nourished. No distress.  HENT:  Head: Normocephalic and atraumatic.  Right Ear: Tympanic membrane, external ear and ear canal normal.  Left Ear: Tympanic membrane, external ear and ear canal normal.  Nose: Nose normal. No rhinorrhea.  Mouth/Throat: Uvula is midline, oropharynx is clear and moist and mucous membranes are normal. No posterior oropharyngeal erythema.  Eyes: Conjunctivae and EOM are normal. Pupils are equal, round, and reactive to light.  Right eye exhibits no discharge. Left eye exhibits no discharge. No scleral icterus.  Neck: Normal range of motion. Neck supple. No thyromegaly present.  Mildly enlarged right tonsillar lymph node.   Cardiovascular: Normal rate, regular rhythm, normal heart sounds and intact distal pulses.   Pulmonary/Chest: Effort normal and breath sounds normal. No respiratory distress. She has no wheezes. She has no rales.  Good air movement.   Abdominal: Soft. Bowel sounds are normal. There is no tenderness.  Genitourinary: Vagina normal and uterus normal. No breast swelling, tenderness, discharge or bleeding. Cervix exhibits no motion tenderness and no friability. Right adnexum displays no mass and no tenderness. Left adnexum displays no mass and no tenderness.  Genitourinary Comments: Normal pelvic exam. Question of tiny endocervical polyp versus some slight erythema of endocervical canal.   Musculoskeletal: She exhibits no edema.  Lymphadenopathy:    She has no cervical adenopathy.  Neurological: She is alert and oriented to person, place, and time. She has normal reflexes.  Skin: Skin is warm and dry. She is not diaphoretic. No erythema.  Psychiatric: She has a normal mood and affect. Her behavior is normal.    Vitals:   07/01/16 1137  BP: 122/72  Pulse: 64  Resp: 17  Temp: 97.5 F (36.4 C)  TempSrc: Oral  SpO2: 98%  Weight: 153 lb (69.4 kg)  Height: 5\' 5"  (1.651 m)         Assessment & Plan:   10 year ASCVD risk 2.2 using last lipid panel in 2014 and today's data.  1. Annual physical exam   2. Routine screening for STI (sexually transmitted infection)   3. Encounter for screening mammogram for breast cancer   4. Screening for cardiovascular, respiratory, and genitourinary diseases   5. Screening for cervical cancer   6. Screening for deficiency anemia   7. Screening for thyroid disorder   8.      ASCUS pap with neg HR HPV 01/3012 - no prior abnml, pt was likely perimenopausal at  that time, no recurrent problems with bv, no hx that might trigger cervical cell irritation. Guidelines rec repeat cotesting in 3 yrs so done today  Orders Placed This Encounter  Procedures  . MM Digital Screening    Standing Status:   Future    Standing Expiration Date:   08/29/2017    Scheduling Instructions:     Pt requests to be called to schedule, thanks.    Order Specific Question:   Reason for Exam (SYMPTOM  OR DIAGNOSIS REQUIRED)    Answer:   screening    Order Specific Question:   Is the patient pregnant?    Answer:   No    Order Specific Question:  Preferred imaging location?    Answer:   [facility]  . CBC  . Comprehensive metabolic panel    Order Specific Question:   Has the patient fasted?    Answer:   Yes  . TSH  . Lipid panel    Order Specific Question:   Has the patient fasted?    Answer:   Yes  . HIV antibody  . Hepatitis C Antibody  . Care order/instruction:    Scheduling Instructions:     Complete orders, AVS and go.  Marland Kitchen POCT urinalysis dipstick    I personally performed the services described in this documentation, which was scribed in my presence. The recorded information has been reviewed and considered, and addended by me as needed.   [name], M.D.  [facility] [address] [place], [place] [address] [phone] phone [phone] fax  07/01/16 4:53 PM

## 2016-07-01 NOTE — Patient Instructions (Addendum)
   IF you received an x-ray today, you will receive an invoice from [facility]. Please contact [facility] at [phone] with questions or concerns regarding your invoice.   IF you received labwork today, you will receive an invoice from [facility]. Please contact [facility] at [phone] with questions or concerns regarding your invoice.   Our billing staff will not be able to assist you with questions regarding bills from these companies.  You will be contacted with the lab results as soon as they are available. The fastest way to get your results is to activate your My Chart account. Instructions are located on the last page of this paperwork. If you have not heard from us regarding the results in 2 weeks, please contact this office.     Health Maintenance, Female Introduction Adopting a healthy lifestyle and getting preventive care can go a long way to promote health and wellness. Talk with your health care provider about what schedule of regular examinations is right for you. This is a good chance for you to check in with your provider about disease prevention and staying healthy. In between checkups, there are plenty of things you can do on your own. Experts have done a lot of research about which lifestyle changes and preventive measures are most likely to keep you healthy. Ask your health care provider for more information. Weight and diet Eat a healthy diet  Be sure to include plenty of vegetables, fruits, low-fat dairy products, and lean protein.  Do not eat a lot of foods high in solid fats, added sugars, or salt.  Get regular exercise. This is one of the most important things you can do for your health.  Most adults should exercise for at least 150 minutes each week. The exercise should increase your heart rate and make you sweat (moderate-intensity exercise).  Most adults should also do strengthening exercises at least twice a week. This is in addition to  the moderate-intensity exercise. Maintain a healthy weight  Body mass index (BMI) is a measurement that can be used to identify possible weight problems. It estimates body fat based on height and weight. Your health care provider can help determine your BMI and help you achieve or maintain a healthy weight.  For females 20 years of age and older:  A BMI below 18.5 is considered underweight.  A BMI of 18.5 to 24.9 is normal.  A BMI of 25 to 29.9 is considered overweight.  A BMI of 30 and above is considered obese. Watch levels of cholesterol and blood lipids  You should start having your blood tested for lipids and cholesterol at 55 years of age, then have this test every 5 years.  You may need to have your cholesterol levels checked more often if:  Your lipid or cholesterol levels are high.  You are older than 55 years of age.  You are at high risk for heart disease. Cancer screening Lung Cancer  Lung cancer screening is recommended for adults 55-80 years old who are at high risk for lung cancer because of a history of smoking.  A yearly low-dose CT scan of the lungs is recommended for people who:  Currently smoke.  Have quit within the past 15 years.  Have at least a 30-pack-year history of smoking. A pack year is smoking an average of one pack of cigarettes a day for 1 year.  Yearly screening should continue until it has been 15 years since you quit.  Yearly screening should   stop if you develop a health problem that would prevent you from having lung cancer treatment. Breast Cancer  Practice breast self-awareness. This means understanding how your breasts normally appear and feel.  It also means doing regular breast self-exams. Let your health care provider know about any changes, no matter how small.  If you are in your 20s or 30s, you should have a clinical breast exam (CBE) by a health care provider every 1-3 years as part of a regular health exam.  If you are 40  or older, have a CBE every year. Also consider having a breast X-ray (mammogram) every year.  If you have a family history of breast cancer, talk to your health care provider about genetic screening.  If you are at high risk for breast cancer, talk to your health care provider about having an MRI and a mammogram every year.  Breast cancer gene (BRCA) assessment is recommended for women who have family members with BRCA-related cancers. BRCA-related cancers include:  Breast.  Ovarian.  Tubal.  Peritoneal cancers.  Results of the assessment will determine the need for genetic counseling and BRCA1 and BRCA2 testing. Cervical Cancer  Your health care provider may recommend that you be screened regularly for cancer of the pelvic organs (ovaries, uterus, and vagina). This screening involves a pelvic examination, including checking for microscopic changes to the surface of your cervix (Pap test). You may be encouraged to have this screening done every 3 years, beginning at age 21.  For women ages 30-65, health care providers may recommend pelvic exams and Pap testing every 3 years, or they may recommend the Pap and pelvic exam, combined with testing for human papilloma virus (HPV), every 5 years. Some types of HPV increase your risk of cervical cancer. Testing for HPV may also be done on women of any age with unclear Pap test results.  Other health care providers may not recommend any screening for nonpregnant women who are considered low risk for pelvic cancer and who do not have symptoms. Ask your health care provider if a screening pelvic exam is right for you.  If you have had past treatment for cervical cancer or a condition that could lead to cancer, you need Pap tests and screening for cancer for at least 20 years after your treatment. If Pap tests have been discontinued, your risk factors (such as having a new sexual partner) need to be reassessed to determine if screening should resume.  Some women have medical problems that increase the chance of getting cervical cancer. In these cases, your health care provider may recommend more frequent screening and Pap tests. Colorectal Cancer  This type of cancer can be detected and often prevented.  Routine colorectal cancer screening usually begins at 55 years of age and continues through 55 years of age.  Your health care provider may recommend screening at an earlier age if you have risk factors for colon cancer.  Your health care provider may also recommend using home test kits to check for hidden blood in the stool.  A small camera at the end of a tube can be used to examine your colon directly (sigmoidoscopy or colonoscopy). This is done to check for the earliest forms of colorectal cancer.  Routine screening usually begins at age 50.  Direct examination of the colon should be repeated every 5-10 years through 55 years of age. However, you may need to be screened more often if early forms of precancerous polyps or small growths are   found. Skin Cancer  Check your skin from head to toe regularly.  Tell your health care provider about any new moles or changes in moles, especially if there is a change in a mole's shape or color.  Also tell your health care provider if you have a mole that is larger than the size of a pencil eraser.  Always use sunscreen. Apply sunscreen liberally and repeatedly throughout the day.  Protect yourself by wearing long sleeves, pants, a wide-brimmed hat, and sunglasses whenever you are outside. Heart disease, diabetes, and high blood pressure  High blood pressure causes heart disease and increases the risk of stroke. High blood pressure is more likely to develop in:  People who have blood pressure in the high end of the normal range (130-139/85-89 mm Hg).  People who are overweight or obese.  People who are African American.  If you are 18-39 years of age, have your blood pressure checked  every 3-5 years. If you are 40 years of age or older, have your blood pressure checked every year. You should have your blood pressure measured twice-once when you are at a hospital or clinic, and once when you are not at a hospital or clinic. Record the average of the two measurements. To check your blood pressure when you are not at a hospital or clinic, you can use:  An automated blood pressure machine at a pharmacy.  A home blood pressure monitor.  If you are between 55 years and 79 years old, ask your health care provider if you should take aspirin to prevent strokes.  Have regular diabetes screenings. This involves taking a blood sample to check your fasting blood sugar level.  If you are at a normal weight and have a low risk for diabetes, have this test once every three years after 55 years of age.  If you are overweight and have a high risk for diabetes, consider being tested at a younger age or more often. Preventing infection Hepatitis B  If you have a higher risk for hepatitis B, you should be screened for this virus. You are considered at high risk for hepatitis B if:  You were born in a country where hepatitis B is common. Ask your health care provider which countries are considered high risk.  Your parents were born in a high-risk country, and you have not been immunized against hepatitis B (hepatitis B vaccine).  You have HIV or AIDS.  You use needles to inject street drugs.  You live with someone who has hepatitis B.  You have had sex with someone who has hepatitis B.  You get hemodialysis treatment.  You take certain medicines for conditions, including cancer, organ transplantation, and autoimmune conditions. Hepatitis C  Blood testing is recommended for:  Everyone born from 1945 through 1965.  Anyone with known risk factors for hepatitis C. Sexually transmitted infections (STIs)  You should be screened for sexually transmitted infections (STIs) including  gonorrhea and chlamydia if:  You are sexually active and are younger than 55 years of age.  You are older than 55 years of age and your health care provider tells you that you are at risk for this type of infection.  Your sexual activity has changed since you were last screened and you are at an increased risk for chlamydia or gonorrhea. Ask your health care provider if you are at risk.  If you do not have HIV, but are at risk, it may be recommended that you take a   prescription medicine daily to prevent HIV infection. This is called pre-exposure prophylaxis (PrEP). You are considered at risk if:  You are sexually active and do not regularly use condoms or know the HIV status of your partner(s).  You take drugs by injection.  You are sexually active with a partner who has HIV. Talk with your health care provider about whether you are at high risk of being infected with HIV. If you choose to begin PrEP, you should first be tested for HIV. You should then be tested every 3 months for as long as you are taking PrEP. Pregnancy  If you are premenopausal and you may become pregnant, ask your health care provider about preconception counseling.  If you may become pregnant, take 400 to 800 micrograms (mcg) of folic acid every day.  If you want to prevent pregnancy, talk to your health care provider about birth control (contraception). Osteoporosis and menopause  Osteoporosis is a disease in which the bones lose minerals and strength with aging. This can result in serious bone fractures. Your risk for osteoporosis can be identified using a bone density scan.  If you are 65 years of age or older, or if you are at risk for osteoporosis and fractures, ask your health care provider if you should be screened.  Ask your health care provider whether you should take a calcium or vitamin D supplement to lower your risk for osteoporosis.  Menopause may have certain physical symptoms and risks.  Hormone  replacement therapy may reduce some of these symptoms and risks. Talk to your health care provider about whether hormone replacement therapy is right for you. Follow these instructions at home:  Schedule regular health, dental, and eye exams.  Stay current with your immunizations.  Do not use any tobacco products including cigarettes, chewing tobacco, or electronic cigarettes.  If you are pregnant, do not drink alcohol.  If you are breastfeeding, limit how much and how often you drink alcohol.  Limit alcohol intake to no more than 1 drink per day for nonpregnant women. One drink equals 12 ounces of beer, 5 ounces of wine, or 1 ounces of hard liquor.  Do not use street drugs.  Do not share needles.  Ask your health care provider for help if you need support or information about quitting drugs.  Tell your health care provider if you often feel depressed.  Tell your health care provider if you have ever been abused or do not feel safe at home. This information is not intended to replace advice given to you by your health care provider. Make sure you discuss any questions you have with your health care provider. Document Released: 12/09/2010 Document Revised: 11/01/2015 Document Reviewed: 02/27/2015  2017 Elsevier  

## 2016-07-02 LAB — HEPATITIS C ANTIBODY

## 2016-07-02 LAB — COMPREHENSIVE METABOLIC PANEL
ALT: 16 IU/L (ref 0–32)
AST: 20 IU/L (ref 0–40)
Albumin/Globulin Ratio: 1.6 (ref 1.2–2.2)
Albumin: 4.7 g/dL (ref 3.5–5.5)
Alkaline Phosphatase: 71 IU/L (ref 39–117)
BILIRUBIN TOTAL: 0.4 mg/dL (ref 0.0–1.2)
BUN/Creatinine Ratio: 16 (ref 9–23)
BUN: 13 mg/dL (ref 6–24)
CHLORIDE: 100 mmol/L (ref 96–106)
CO2: 23 mmol/L (ref 18–29)
CREATININE: 0.79 mg/dL (ref 0.57–1.00)
Calcium: 10 mg/dL (ref 8.7–10.2)
GFR calc Af Amer: 97 mL/min/{1.73_m2} (ref >59)
GFR calc non Af Amer: 85 mL/min/{1.73_m2} (ref >59)
Globulin, Total: 2.9 g/dL (ref 1.5–4.5)
Glucose: 87 mg/dL (ref 65–99)
Potassium: 4.8 mmol/L (ref 3.5–5.2)
SODIUM: 141 mmol/L (ref 134–144)
Total Protein: 7.6 g/dL (ref 6.0–8.5)

## 2016-07-02 LAB — HIV ANTIBODY (ROUTINE TESTING W REFLEX): HIV Screen 4th Generation wRfx: NONREACTIVE

## 2016-07-02 LAB — LIPID PANEL
Chol/HDL Ratio: 4.3 ratio units (ref 0.0–4.4)
Cholesterol, Total: 238 mg/dL — ABNORMAL HIGH (ref 100–199)
HDL: 56 mg/dL (ref >39)
LDL Calculated: 153 mg/dL — ABNORMAL HIGH (ref 0–99)
Triglycerides: 147 mg/dL (ref 0–149)
VLDL Cholesterol Cal: 29 mg/dL (ref 5–40)

## 2016-07-02 LAB — CBC
Hematocrit: 44.2 % (ref 34.0–46.6)
Hemoglobin: 14.4 g/dL (ref 11.1–15.9)
MCH: 29.6 pg (ref 26.6–33.0)
MCHC: 32.6 g/dL (ref 31.5–35.7)
MCV: 91 fL (ref 79–97)
Platelets: 349 10*3/uL (ref 150–379)
RBC: 4.86 x10E6/uL (ref 3.77–5.28)
RDW: 13.8 % (ref 12.3–15.4)
WBC: 6 10*3/uL (ref 3.4–10.8)

## 2016-07-02 LAB — TSH: TSH: 2.85 u[IU]/mL (ref 0.450–4.500)

## 2016-07-03 LAB — PAP IG AND HPV HIGH-RISK
HPV, high-risk: NEGATIVE
PAP SMEAR COMMENT: 0

## 2016-07-10 ENCOUNTER — Ambulatory Visit
Admission: RE | Admit: 2016-07-10 | Discharge: 2016-07-10 | Disposition: A | Discharge status: Home / Self Care | Payer: BLUE CROSS/BLUE SHIELD | Source: Ambulatory Visit | Attending: Family Medicine | Admitting: Family Medicine

## 2016-07-10 DIAGNOSIS — Z1231 Encounter for screening mammogram for malignant neoplasm of breast: Secondary | ICD-10-CM

## 2016-07-10 DIAGNOSIS — Z0000 Encounter for general adult medical examination without abnormal findings: Secondary | ICD-10-CM

## 2016-11-11 ENCOUNTER — Telehealth: Payer: Self-pay | Admitting: Family Medicine

## 2016-11-11 NOTE — Telephone Encounter (Signed)
Please advise 

## 2016-11-11 NOTE — Telephone Encounter (Signed)
PATIENT STATES SHE HAD A COMPLETE PHYSICAL DONE WITH DR. SHAW ON JAN. 23, 2018. SHE STATES DR. SHAW ORDERED HER TO HAVE A TSH LAB TEST AND SHE WOULD LIKE TO KNOW WHY? SHE SAID SHE DOES NOT HAVE ANY THYROID PROBLEMS AT ALL AND THAT IT WAS NOT DISCUSSED WITH HER. HER BCBS WILL NOT PAY FOR IT AND THE CHARGE IS $43.00. BEST PHONE [phone] (CELL) MBC

## 2016-11-12 NOTE — Telephone Encounter (Signed)
A tsh is a routine part of the full physical screening lab work just as was done the last time she had full physical labs in 2013 and 2014.  I have no idea what her BCBS would not pay for that but would for her other labs - I have never had this prob with BCBS prior. I am happy to try to recode the chart if we could resubmit or perhaps someone could contact the ins to see what their problem is.

## 2016-11-13 NOTE — Telephone Encounter (Signed)
This was brought to me to review.  I checked the lab to see what was attached.  The primary dx attached determines payment and the physical CPT code is primary (Z00.00).  So I don't understand why they are denying it either.  I don't know what the insurance is wanting, but it seems when we switched to [facility] these type of issues have happened a lot more.

## 2016-11-14 NOTE — Telephone Encounter (Signed)
Danella DeisShay - could you please contact [facility] and see if maybe they can rerun this tsh charge to see if clears? It always has in prior years on pt. And always paid for on every other BCBS physicial. . . Thanks!

## 2016-11-24 NOTE — Telephone Encounter (Signed)
Please f/u and document findings. Ty

## 2016-11-25 NOTE — Telephone Encounter (Signed)
Shay noted in internal staff message Johnson, Shaquetta D  Iosefa Weintraub N, MD; Smith, Jill D, RN         I spoke with [facility] and they stated they had the wrong insurance information. They are re submitting the claim, but her current plan do not cover TSH. They have advised the patient to call her insurance company and ask for the correct billing code so her TSH can be covered.  SJ       

## 2016-11-26 ENCOUNTER — Telehealth: Payer: Self-pay | Admitting: Emergency Medicine

## 2016-11-26 NOTE — Telephone Encounter (Signed)
Shay noted in internal staff message [place] CenterJohnson, Orpah CobbShaquetta D  Shaw, Eva N, MD; Darlis LoanSmith, Jill D, RN         I spoke with [facility] and they stated they had the wrong insurance information. They are re submitting the claim, but her current plan do not cover TSH. They have advised the patient to call her insurance company and ask for the correct billing code so her TSH can be covered.  SJ

## 2016-11-26 NOTE — Telephone Encounter (Signed)
Yes, I was thinking the same thing. This is a issue with her insurance. I will give them a call.  Thanks,

## 2016-11-26 NOTE — Telephone Encounter (Signed)
Dr. Clelia CroftShaw, This patient called in regarding a TSH test done 07/01/16  States, she does not have any medical issues and doesn't know why we tested her thyroid.   She is upset test was not mentioned during physical exam and the reason. I explained the test was a screening for thyroid disease.  Please advise. Her insurance cost out of pocket was $42.00

## 2016-11-26 NOTE — Telephone Encounter (Signed)
Please see last telephone note about the exact same thing. This is a standard part of every complete physical just like her other labs and has been done at every other one of her complete physicals that she has had here. I'm sorry I did not review the standard lab work in detail with her. However this is the first time I have ever had a private insurance does not cover it and so I have absolutely no idea this would happen. She has [employer] and I suspect this is a problem between the way [facility] is filing this with her insurance.  I'm not sure what our options here are.  Per the last telephone note Danella DeisShay talked to [facility] who advised patient to contact her insurance to see what code they recommend to cover the billing. This seems like something perhaps a we ordered [facility] could do rather than making the port patient responsible. If we don't have time to contact her insurance to see how to get this covered, then I guess we need to assume the cost and have them dr. bill it to us.

## 2016-12-02 ENCOUNTER — Ambulatory Visit: Payer: BLUE CROSS/BLUE SHIELD | Admitting: Physician Assistant

## 2016-12-03 NOTE — Telephone Encounter (Signed)
Any update?

## 2016-12-11 NOTE — Telephone Encounter (Signed)
Did you find anything out on this?  Thanks

## 2017-01-19 NOTE — Telephone Encounter (Signed)
Would you please call pt and see if she was ever able to work with the lab and her insurance to get this covered?  If not, I will modify my note/association and resubmit claim so she can hopefully get reimbursed. . . .

## 2017-01-24 NOTE — Telephone Encounter (Signed)
Spoke with patient and she stated that she talked with the insurance company and they told her that TSH was not covered by her plan. Asked the patient if they gave her a reason and she stated that they did not just that it is no longer covered anymore. I don't know if modifying your note and resubmitting the claim is going to help at this point but she wants it on her record somewhere to not have that test done anymore.

## 2017-05-06 ENCOUNTER — Encounter: Payer: BLUE CROSS/BLUE SHIELD | Admitting: Family Medicine

## 2019-08-04 ENCOUNTER — Ambulatory Visit: Payer: Self-pay | Attending: Internal Medicine

## 2019-08-04 DIAGNOSIS — Z23 Encounter for immunization: Secondary | ICD-10-CM | POA: Insufficient documentation

## 2019-08-04 NOTE — Progress Notes (Signed)
   Covid-19 Vaccination Clinic  Name:  Patricia Campos    MRN: [id number] DOB: [birth year]  08/04/2019  Patricia Campos was observed post Covid-19 immunization for 15 minutes without incidence. She was provided with Vaccine Information Sheet and instruction to access the V-Safe system.   Patricia Campos was instructed to call 911 with any severe reactions post vaccine: Marland Kitchen Difficulty breathing  . Swelling of your face and throat  . A fast heartbeat  . A bad rash all over your body  . Dizziness and weakness    Immunizations Administered    Name Date Dose VIS Date Route   Pfizer COVID-19 Vaccine 08/04/2019  4:25 PM 0.3 mL 05/20/2019 Intramuscular   Manufacturer: [employer], [employer]   Lot: [id number]   NDC: [address]-9

## 2019-08-05 ENCOUNTER — Ambulatory Visit: Payer: Self-pay

## 2019-08-31 ENCOUNTER — Ambulatory Visit: Payer: Self-pay | Attending: Internal Medicine

## 2019-08-31 DIAGNOSIS — Z23 Encounter for immunization: Secondary | ICD-10-CM

## 2019-08-31 NOTE — Progress Notes (Signed)
   Covid-19 Vaccination Clinic  Name:  Shanele Nissan    MRN: [id number] DOB: [birth year]  08/31/2019  Ms. Fine was observed post Covid-19 immunization for 15 minutes without incident. She was provided with Vaccine Information Sheet and instruction to access the V-Safe system.   Ms. Brame was instructed to call 911 with any severe reactions post vaccine: Marland Kitchen Difficulty breathing  . Swelling of face and throat  . A fast heartbeat  . A bad rash all over body  . Dizziness and weakness   Immunizations Administered    Name Date Dose VIS Date Route   Pfizer COVID-19 Vaccine 08/31/2019  8:20 AM 0.3 mL 05/20/2019 Intramuscular   Manufacturer: [employer], [employer]   Lot: [id number]   NDC: [address]-0

## 2022-04-09 ENCOUNTER — Encounter: Payer: Self-pay | Admitting: Internal Medicine

## 2022-07-31 ENCOUNTER — Encounter: Payer: Self-pay | Admitting: Internal Medicine

## 2022-08-25 ENCOUNTER — Ambulatory Visit (AMBULATORY_SURGERY_CENTER): Payer: Self-pay

## 2022-08-25 VITALS — Ht 65.0 in | Wt 150.0 lb

## 2022-08-25 DIAGNOSIS — Z1211 Encounter for screening for malignant neoplasm of colon: Secondary | ICD-10-CM

## 2022-08-25 NOTE — Progress Notes (Signed)

## 2022-09-08 ENCOUNTER — Encounter: Payer: Self-pay | Admitting: Internal Medicine

## 2022-09-15 ENCOUNTER — Ambulatory Visit: Payer: BC Managed Care – PPO | Admitting: Internal Medicine

## 2022-09-15 ENCOUNTER — Encounter: Payer: Self-pay | Admitting: Internal Medicine

## 2022-09-15 VITALS — BP 110/71 | HR 51 | Temp 96.0°F | Resp 11 | Ht 65.0 in | Wt 152.4 lb

## 2022-09-15 DIAGNOSIS — Z1211 Encounter for screening for malignant neoplasm of colon: Secondary | ICD-10-CM

## 2022-09-15 MED ORDER — SODIUM CHLORIDE 0.9 % IV SOLN: 500.0000 mL | Freq: Once | INTRAVENOUS | Status: DC

## 2022-09-15 NOTE — Op Note (Signed)
[facility] Patient Name: Patricia Campos Procedure Date: 09/15/2022 8:52 AM MRN: [id number] Endoscopist: Iva Boop , MD, [id number] Age: 62 Referring MD:  Date of Birth: 1961-04-14 Gender: Female Account #: [id number] Procedure:                Colonoscopy Indications:              Screening for colorectal malignant neoplasm, Last                            colonoscopy: 2013 Medicines:                Monitored Anesthesia Care Procedure:                Pre-Anesthesia Assessment:                           - Prior to the procedure, a History and Physical                            was performed, and patient medications and                            allergies were reviewed. The patient's tolerance of                            previous anesthesia was also reviewed. The risks                            and benefits of the procedure and the sedation                            options and risks were discussed with the patient.                            All questions were answered, and informed consent                            was obtained. Prior Anticoagulants: The patient has                            taken no anticoagulant or antiplatelet agents. ASA                            Grade Assessment: II - A patient with mild systemic                            disease. After reviewing the risks and benefits,                            the patient was deemed in satisfactory condition to                            undergo the procedure.  After obtaining informed consent, the colonoscope                            was passed under direct vision. Throughout the                            procedure, the patient's blood pressure, pulse, and                            oxygen saturations were monitored continuously. The                            CF [id number] #[id number] was introduced through the anus                            and advanced to the the cecum,  identified by                            appendiceal orifice and ileocecal valve. The                            colonoscopy was performed without difficulty. The                            patient tolerated the procedure well. The quality                            of the bowel preparation was excellent. The                            ileocecal valve, appendiceal orifice, and rectum                            were photographed. The bowel preparation used was                            Miralax via split dose instruction. Scope In: 9:20:38 AM Scope Out: 9:32:36 AM Scope Withdrawal Time: 0 hours 8 minutes 41 seconds  Total Procedure Duration: 0 hours 11 minutes 58 seconds  Findings:                 The perianal and digital rectal examinations were                            normal.                           The entire examined colon appeared normal on direct                            and retroflexion views. Complications:            No immediate complications. Estimated Blood Loss:     Estimated blood loss: none. Impression:               - The entire examined  colon is normal on direct and                            retroflexion views.                           - No specimens collected. Recommendation:           - Patient has a contact number available for                            emergencies. The signs and symptoms of potential                            delayed complications were discussed with the                            patient. Return to normal activities tomorrow.                            Written discharge instructions were provided to the                            patient.                           - Resume previous diet.                           - Continue present medications.                           - Repeat colonoscopy in 10 years for screening                            purposes. Iva Boop, MD 09/15/2022 9:37:12 AM This report has been signed electronically.

## 2022-09-15 NOTE — Patient Instructions (Addendum)
Colonoscopy was normal today - no polyps or cancer were seen.  I appreciate the opportunity to care for you. Iva Boop, MD, FACG  YOU HAD AN ENDOSCOPIC PROCEDURE TODAY AT THE [facility]:   Refer to the procedure report that was given to you for any specific questions about what was found during the examination.  If the procedure report does not answer your questions, please call your gastroenterologist to clarify.  If you requested that your care partner not be given the details of your procedure findings, then the procedure report has been included in a sealed envelope for you to review at your convenience later.  YOU SHOULD EXPECT: Some feelings of bloating in the abdomen. Passage of more gas than usual.  Walking can help get rid of the air that was put into your GI tract during the procedure and reduce the bloating. If you had a lower endoscopy (such as a colonoscopy or flexible sigmoidoscopy) you may notice spotting of blood in your stool or on the toilet paper. If you underwent a bowel prep for your procedure, you may not have a normal bowel movement for a few days.  Please Note:  You might notice some irritation and congestion in your nose or some drainage.  This is from the oxygen used during your procedure.  There is no need for concern and it should clear up in a day or so.  SYMPTOMS TO REPORT IMMEDIATELY:  Following lower endoscopy (colonoscopy or flexible sigmoidoscopy):  Excessive amounts of blood in the stool  Significant tenderness or worsening of abdominal pains  Swelling of the abdomen that is new, acute  Fever of 100F or higher  For urgent or emergent issues, a gastroenterologist can be reached at any hour by calling [phone]. Do not use MyChart messaging for urgent concerns.    DIET:  We do recommend a small meal at first, but then you may proceed to your regular diet.  Drink plenty of fluids but you should avoid alcoholic beverages for 24  hours.  ACTIVITY:  You should plan to take it easy for the rest of today and you should NOT DRIVE or use heavy machinery until tomorrow (because of the sedation medicines used during the test).    FOLLOW UP: Our staff will call the number listed on your records the next business day following your procedure.  We will call around 7:15- 8:00 am to check on you and address any questions or concerns that you may have regarding the information given to you following your procedure. If we do not reach you, we will leave a message.     If any biopsies were taken you will be contacted by phone or by letter within the next 1-3 weeks.  Please call us at [phone] if you have not heard about the biopsies in 3 weeks.    SIGNATURES/CONFIDENTIALITY: You and/or your care partner have signed paperwork which will be entered into your electronic medical record.  These signatures attest to the fact that that the information above on your After Visit Summary has been reviewed and is understood.  Full responsibility of the confidentiality of this discharge information lies with you and/or your care-partner.

## 2022-09-15 NOTE — Progress Notes (Signed)
To pacu, VSS. Report to [email or web address] 

## 2022-09-15 NOTE — Progress Notes (Signed)
[facility] History and Physical   Primary Care Physician:  Patient, No Pcp Per   Reason for Procedure:   CRCA screening  Plan:    colonoscopy     HPI: Patricia Campos is a 62 y.o. female here for screening exam   Past Medical History:  Diagnosis Date   Hearing loss of both ears    tinnitus   Hyperlipidemia     Past Surgical History:  Procedure Laterality Date   BREAST BIOPSY  06/10/1991   right benign   COLONOSCOPY  2013   EYE SURGERY  06/10/1971   lazy eye    Prior to Admission medications   Medication Sig Start Date End Date Taking? Authorizing Provider  betamethasone dipropionate 0.05 % cream Apply topically. Patient not taking: Reported on 08/25/2022 05/23/20   [provider]    Current Outpatient Medications  Medication Sig Dispense Refill   betamethasone dipropionate 0.05 % cream Apply topically. (Patient not taking: Reported on 08/25/2022)     Current Facility-Administered Medications  Medication Dose Route Frequency Provider Last Rate Last Admin   0.9 %  sodium chloride infusion  500 mL Intravenous Once Iva Boop, MD        Allergies as of 09/15/2022   (No Known Allergies)    Family History  Problem Relation Age of Onset   Hyperlipidemia Mother    Irritable bowel syndrome Mother    Hyperlipidemia Brother    Colon cancer Neg Hx    Esophageal cancer Neg Hx    Rectal cancer Neg Hx    Stomach cancer Neg Hx    Colon polyps Neg Hx     Social History   Socioeconomic History   Marital status: Married    Spouse name: Dan   Number of children: 2   Years of education: 22   Highest education level: Not on file  Occupational History   Occupation: professor    Employer: [employer]    Comment: biology  Tobacco Use   Smoking status: Never   Smokeless tobacco: Never  Vaping Use   Vaping Use: Never used  Substance and Sexual Activity   Alcohol use: Yes    Alcohol/week: 1.0 standard drink of alcohol    Types: 1  Glasses of wine per week    Comment: WINE 1 or less GLASSES/WEEK   Drug use: No   Sexual activity: Yes    Partners: Male    Birth control/protection: Condom, Post-menopausal  Other Topics Concern   Not on file  Social History Narrative   Not on file   Social Determinants of Health   Financial Resource Strain: Not on file  Food Insecurity: Not on file  Transportation Needs: Not on file  Physical Activity: Not on file  Stress: Not on file  Social Connections: Not on file  Intimate Partner Violence: Not on file    Review of Systems:  All other review of systems negative except as mentioned in the HPI.  Physical Exam: Vital signs BP 121/70   Pulse 71   Temp (!) 96 F (35.6 C) (Other (Comment)) Comment (Src): forehead  Ht 5\' 5"  (1.651 m)   Wt 152 lb 6.4 oz (69.1 kg)   SpO2 99%   BMI 25.36 kg/m   General:   Alert,  Well-developed, well-nourished, pleasant and cooperative in NAD Lungs:  Clear throughout to auscultation.   Heart:  Regular rate and rhythm; no murmurs, clicks, rubs,  or gallops. Abdomen:  Soft, nontender and nondistended. Normal bowel  sounds.   Neuro/Psych:  Alert and cooperative. Normal mood and affect. A and O x 3   @Adric Wrede  Sena Slate, MD, [facility] [phone] (pager) 09/15/2022 9:04 AM@

## 2022-09-15 NOTE — Progress Notes (Signed)
Pt's states no medical or surgical changes since previsit or office visit. 

## 2022-09-16 ENCOUNTER — Telehealth: Payer: Self-pay | Admitting: *Deleted

## 2022-09-16 NOTE — Telephone Encounter (Signed)
  Follow up Call-     09/15/2022    9:00 AM  Call back number  Post procedure Call Back phone  # [phone]  Permission to leave phone message Yes     Patient questions:  Do you have a fever, pain , or abdominal swelling? No. Pain Score  0 *  Have you tolerated food without any problems? Yes.    Have you been able to return to your normal activities? Yes.    Do you have any questions about your discharge instructions: Diet   No. Medications  No. Follow up visit  No.  Do you have questions or concerns about your Care? No.  Actions: * If pain score is 4 or above: No action needed, pain <4.
